# Patient Record
Sex: Male | Born: 1945 | Hispanic: Refuse to answer
Health system: Midwestern US, Academic
[De-identification: ages and names within clinical notes are randomized; demographics above are authoritative.]

---

## 2017-02-18 ENCOUNTER — Ambulatory Visit: Admit: 2017-02-18 | Discharge: 2017-02-19 | Payer: MEDICARE

## 2017-02-18 ENCOUNTER — Encounter: Admit: 2017-02-18 | Discharge: 2017-02-18

## 2017-02-18 DIAGNOSIS — I25118 Atherosclerotic heart disease of native coronary artery with other forms of angina pectoris: ICD-10-CM

## 2017-02-18 DIAGNOSIS — I10 Essential (primary) hypertension: ICD-10-CM

## 2017-02-18 DIAGNOSIS — C61 Malignant neoplasm of prostate: ICD-10-CM

## 2017-02-18 DIAGNOSIS — I4891 Unspecified atrial fibrillation: Principal | ICD-10-CM

## 2017-02-18 DIAGNOSIS — I480 Paroxysmal atrial fibrillation: Principal | ICD-10-CM

## 2017-02-18 DIAGNOSIS — I2510 Atherosclerotic heart disease of native coronary artery without angina pectoris: ICD-10-CM

## 2017-02-18 DIAGNOSIS — I447 Left bundle-branch block, unspecified: ICD-10-CM

## 2017-02-18 DIAGNOSIS — Q2549 Other congenital malformations of aorta: ICD-10-CM

## 2017-02-18 DIAGNOSIS — E785 Hyperlipidemia, unspecified: ICD-10-CM

## 2017-02-18 DIAGNOSIS — Z720 Tobacco use: Principal | ICD-10-CM

## 2017-02-18 LAB — COMPREHENSIVE METABOLIC PANEL
Lab: 0.9 — ABNORMAL LOW (ref 33.0–37.0)
Lab: 1.4 — ABNORMAL HIGH (ref 0.0–1.0)
Lab: 107
Lab: 140
Lab: 19
Lab: 27
Lab: 3.8
Lab: 38 — ABNORMAL HIGH (ref 5–34)
Lab: 43
Lab: 6.8
Lab: 81
Lab: 9.4
Lab: 90

## 2017-02-18 LAB — MAGNESIUM: Lab: 2.1 — ABNORMAL HIGH (ref 42.0–52.0)

## 2017-02-18 LAB — CBC: Lab: 6.4

## 2017-02-18 LAB — THYROID STIMULATING HORMONE-TSH: Lab: 1.7

## 2017-02-18 MED ORDER — APIXABAN 5 MG PO TAB
5 mg | ORAL_TABLET | Freq: Two times a day (BID) | ORAL | 3 refills | Status: AC
Start: 2017-02-18 — End: 2017-06-21

## 2017-02-18 MED ORDER — METOPROLOL TARTRATE 50 MG PO TAB
50 mg | ORAL_TABLET | Freq: Two times a day (BID) | ORAL | 3 refills | 90.00000 days | Status: AC
Start: 2017-02-18 — End: 2017-03-03

## 2017-02-22 ENCOUNTER — Encounter: Admit: 2017-02-22 | Discharge: 2017-02-22

## 2017-02-23 ENCOUNTER — Encounter: Admit: 2017-02-23 | Discharge: 2017-02-23

## 2017-02-23 ENCOUNTER — Ambulatory Visit: Admit: 2017-02-23 | Discharge: 2017-02-23

## 2017-02-23 ENCOUNTER — Ambulatory Visit: Admit: 2017-02-23 | Discharge: 2017-02-23 | Payer: MEDICARE

## 2017-02-23 DIAGNOSIS — I4891 Unspecified atrial fibrillation: Principal | ICD-10-CM

## 2017-02-23 LAB — POC POTASSIUM: Lab: 3.9 MMOL/L (ref 3.5–5.1)

## 2017-02-23 MED ORDER — PROPOFOL 10 MG/ML IV EMUL 20 ML (INFUSION)(AM)(OR)
INTRAVENOUS | 0 refills | Status: DC
Start: 2017-02-23 — End: 2017-02-23
  Administered 2017-02-23: 15:00:00 100 ug/kg/min via INTRAVENOUS

## 2017-02-23 MED ORDER — PROPOFOL INJ 10 MG/ML IV VIAL
0 refills | Status: DC
Start: 2017-02-23 — End: 2017-02-23
  Administered 2017-02-23 (×2): 20 mg via INTRAVENOUS
  Administered 2017-02-23 (×2): 30 mg via INTRAVENOUS

## 2017-02-23 MED ORDER — SODIUM CHLORIDE 0.9 % IV SOLP (OR) 500ML
0 refills | Status: DC
Start: 2017-02-23 — End: 2017-02-23
  Administered 2017-02-23: 15:00:00 via INTRAVENOUS

## 2017-03-01 ENCOUNTER — Encounter: Admit: 2017-03-01 | Discharge: 2017-03-01

## 2017-03-02 ENCOUNTER — Encounter: Admit: 2017-03-02 | Discharge: 2017-03-02

## 2017-03-03 ENCOUNTER — Encounter: Admit: 2017-03-03 | Discharge: 2017-03-03

## 2017-03-09 ENCOUNTER — Encounter: Admit: 2017-03-09 | Discharge: 2017-03-09

## 2017-03-09 DIAGNOSIS — I447 Left bundle-branch block, unspecified: ICD-10-CM

## 2017-03-09 DIAGNOSIS — E785 Hyperlipidemia, unspecified: ICD-10-CM

## 2017-03-09 DIAGNOSIS — I2510 Atherosclerotic heart disease of native coronary artery without angina pectoris: ICD-10-CM

## 2017-03-09 DIAGNOSIS — Q2549 Other congenital malformations of aorta: ICD-10-CM

## 2017-03-09 DIAGNOSIS — C61 Malignant neoplasm of prostate: ICD-10-CM

## 2017-03-09 DIAGNOSIS — I480 Paroxysmal atrial fibrillation: Principal | ICD-10-CM

## 2017-03-09 DIAGNOSIS — Z720 Tobacco use: Principal | ICD-10-CM

## 2017-03-09 DIAGNOSIS — I10 Essential (primary) hypertension: ICD-10-CM

## 2017-03-10 ENCOUNTER — Ambulatory Visit: Admit: 2017-03-09 | Discharge: 2017-03-10 | Payer: MEDICARE

## 2017-03-10 DIAGNOSIS — E039 Hypothyroidism, unspecified: ICD-10-CM

## 2017-03-10 DIAGNOSIS — I481 Persistent atrial fibrillation: Principal | ICD-10-CM

## 2017-03-10 DIAGNOSIS — I2510 Atherosclerotic heart disease of native coronary artery without angina pectoris: ICD-10-CM

## 2017-03-10 DIAGNOSIS — I501 Left ventricular failure: ICD-10-CM

## 2017-03-10 DIAGNOSIS — I4891 Unspecified atrial fibrillation: ICD-10-CM

## 2017-03-10 DIAGNOSIS — I10 Essential (primary) hypertension: ICD-10-CM

## 2017-03-10 DIAGNOSIS — I447 Left bundle-branch block, unspecified: ICD-10-CM

## 2017-03-18 DIAGNOSIS — I10 Essential (primary) hypertension: ICD-10-CM

## 2017-03-25 ENCOUNTER — Encounter: Admit: 2017-03-25 | Discharge: 2017-03-25

## 2017-03-25 ENCOUNTER — Inpatient Hospital Stay: Admit: 2017-03-25 | Discharge: 2017-03-25

## 2017-03-25 DIAGNOSIS — I447 Left bundle-branch block, unspecified: ICD-10-CM

## 2017-03-25 DIAGNOSIS — Z720 Tobacco use: Principal | ICD-10-CM

## 2017-03-25 DIAGNOSIS — I2510 Atherosclerotic heart disease of native coronary artery without angina pectoris: ICD-10-CM

## 2017-03-25 DIAGNOSIS — I10 Essential (primary) hypertension: ICD-10-CM

## 2017-03-25 DIAGNOSIS — F1011 Alcohol abuse, in remission: ICD-10-CM

## 2017-03-25 DIAGNOSIS — E785 Hyperlipidemia, unspecified: ICD-10-CM

## 2017-03-25 DIAGNOSIS — Q2549 Other congenital malformations of aorta: ICD-10-CM

## 2017-03-25 DIAGNOSIS — I480 Paroxysmal atrial fibrillation: ICD-10-CM

## 2017-03-25 DIAGNOSIS — C61 Malignant neoplasm of prostate: ICD-10-CM

## 2017-03-25 LAB — UREA NITROGEN-URINE RANDOM: Lab: 860 mg/dL

## 2017-03-25 LAB — URINALYSIS, MICROSCOPIC

## 2017-03-25 LAB — CBC
Lab: 118 10*3/uL — ABNORMAL LOW (ref >60)
Lab: 15 % — ABNORMAL HIGH (ref >60)
Lab: 29 pg (ref 26–34)
Lab: 32 g/dL (ref 32.0–36.0)
Lab: 49 % — ABNORMAL HIGH (ref 40–50)
Lab: 5.4 M/UL — ABNORMAL HIGH (ref 4.4–5.5)
Lab: 6 K/UL (ref 4.5–11.0)
Lab: 9.7 FL (ref >60)
Lab: 90 FL — ABNORMAL HIGH (ref 80–100)

## 2017-03-25 LAB — BNP (B-TYPE NATRIURETIC PEPTI)
Lab: 608 pg/mL — ABNORMAL HIGH (ref 0–100)
Lab: 666 pg/mL — ABNORMAL HIGH (ref 0–100)

## 2017-03-25 LAB — CREATININE-URINE RANDOM: Lab: 137 mg/dL

## 2017-03-25 LAB — URINALYSIS DIPSTICK
Lab: NEGATIVE
Lab: NEGATIVE

## 2017-03-25 LAB — SODIUM-URINE RANDOM: Lab: 174 MMOL/L

## 2017-03-25 LAB — BASIC METABOLIC PANEL
Lab: 140 MMOL/L (ref 137–147)
Lab: 4.4 MMOL/L (ref 3.5–5.1)

## 2017-03-25 LAB — MAGNESIUM: Lab: 2.2 mg/dL (ref 1.6–2.6)

## 2017-03-25 MED ORDER — APIXABAN 5 MG PO TAB
5 mg | Freq: Two times a day (BID) | ORAL | 0 refills | Status: DC
Start: 2017-03-25 — End: 2017-03-27
  Administered 2017-03-26 – 2017-03-27 (×3): 5 mg via ORAL

## 2017-03-25 MED ORDER — POTASSIUM CHLORIDE 20 MEQ/15 ML PO LIQD
20-40 meq | ORAL | 0 refills | Status: AC | PRN
Start: 2017-03-25 — End: ?

## 2017-03-25 MED ORDER — FUROSEMIDE 20 MG PO TAB
20 mg | Freq: Every morning | ORAL | 0 refills | Status: DC
Start: 2017-03-25 — End: 2017-03-25

## 2017-03-25 MED ORDER — POTASSIUM CHLORIDE 20 MEQ PO TBTQ
20-40 meq | ORAL | 0 refills | Status: AC | PRN
Start: 2017-03-25 — End: ?

## 2017-03-25 MED ORDER — ASPIRIN 81 MG PO TBEC
81 mg | Freq: Every day | ORAL | 0 refills | Status: DC
Start: 2017-03-25 — End: 2017-03-27
  Administered 2017-03-26 – 2017-03-27 (×2): 81 mg via ORAL

## 2017-03-25 MED ORDER — POTASSIUM CHLORIDE 10 MEQ PO TBTQ
10 meq | Freq: Every day | ORAL | 0 refills | Status: DC
Start: 2017-03-25 — End: 2017-03-25

## 2017-03-25 MED ORDER — SODIUM CHLORIDE 0.9 % IV SOLP
INTRAVENOUS | 0 refills | Status: DC
Start: 2017-03-25 — End: 2017-03-27
  Administered 2017-03-26: 16:00:00 1000.000 mL via INTRAVENOUS

## 2017-03-25 MED ORDER — SODIUM CHLORIDE 0.9 % IV SOLP
500 mL | Freq: Once | INTRAVENOUS | 0 refills | Status: CP
Start: 2017-03-25 — End: ?
  Administered 2017-03-25: 21:00:00 500 mL via INTRAVENOUS

## 2017-03-25 MED ORDER — SOTALOL 120 MG PO TAB
120 mg | Freq: Every day | ORAL | 0 refills | Status: DC
Start: 2017-03-25 — End: 2017-03-26
  Administered 2017-03-25: 20:00:00 120 mg via ORAL

## 2017-03-25 MED ORDER — SODIUM CHLORIDE 0.9 % IV SOLP
INTRAVENOUS | 0 refills | Status: CN
Start: 2017-03-25 — End: ?

## 2017-03-25 MED ORDER — MAGNESIUM SULFATE IN D5W 1 GRAM/100 ML IV PGBK
1 g | INTRAVENOUS | 0 refills | Status: AC | PRN
Start: 2017-03-25 — End: ?

## 2017-03-25 MED ORDER — METOPROLOL TARTRATE 50 MG PO TAB
50 mg | Freq: Two times a day (BID) | ORAL | 0 refills | Status: DC
Start: 2017-03-25 — End: 2017-03-27
  Administered 2017-03-26 – 2017-03-27 (×3): 50 mg via ORAL

## 2017-03-25 MED ORDER — ATORVASTATIN 40 MG PO TAB
80 mg | Freq: Every evening | ORAL | 0 refills | Status: DC
Start: 2017-03-25 — End: 2017-03-27
  Administered 2017-03-27: 02:00:00 80 mg via ORAL

## 2017-03-26 ENCOUNTER — Inpatient Hospital Stay: Admit: 2017-03-26 | Discharge: 2017-03-26

## 2017-03-26 ENCOUNTER — Encounter: Admit: 2017-03-26 | Discharge: 2017-03-26

## 2017-03-26 ENCOUNTER — Ambulatory Visit: Admit: 2017-03-26 | Discharge: 2017-03-26

## 2017-03-26 ENCOUNTER — Ambulatory Visit: Admit: 2017-03-26 | Discharge: 2017-03-26 | Payer: MEDICARE

## 2017-03-26 DIAGNOSIS — Z720 Tobacco use: Principal | ICD-10-CM

## 2017-03-26 DIAGNOSIS — I481 Persistent atrial fibrillation: Principal | ICD-10-CM

## 2017-03-26 DIAGNOSIS — I480 Paroxysmal atrial fibrillation: Secondary | ICD-10-CM

## 2017-03-26 DIAGNOSIS — Q2549 Other congenital malformations of aorta: ICD-10-CM

## 2017-03-26 DIAGNOSIS — I2510 Atherosclerotic heart disease of native coronary artery without angina pectoris: ICD-10-CM

## 2017-03-26 DIAGNOSIS — F1011 Alcohol abuse, in remission: ICD-10-CM

## 2017-03-26 DIAGNOSIS — I10 Essential (primary) hypertension: ICD-10-CM

## 2017-03-26 DIAGNOSIS — C61 Malignant neoplasm of prostate: ICD-10-CM

## 2017-03-26 DIAGNOSIS — I447 Left bundle-branch block, unspecified: ICD-10-CM

## 2017-03-26 DIAGNOSIS — E785 Hyperlipidemia, unspecified: ICD-10-CM

## 2017-03-26 LAB — MAGNESIUM: Lab: 2.2 mg/dL — ABNORMAL LOW (ref 1.6–2.6)

## 2017-03-26 LAB — PROTIME INR (PT): Lab: 1.7 MMOL/L — ABNORMAL HIGH (ref 0.8–1.2)

## 2017-03-26 LAB — BASIC METABOLIC PANEL
Lab: 140 MMOL/L — ABNORMAL HIGH (ref 137–147)
Lab: 4.7 MMOL/L — ABNORMAL HIGH (ref 3.5–5.1)

## 2017-03-26 LAB — LIPID PROFILE: Lab: 114 mg/dL — ABNORMAL LOW (ref <200)

## 2017-03-26 MED ORDER — SODIUM CHLORIDE 0.9 % IJ SOLN
50 mL | Freq: Once | INTRAVENOUS | 0 refills | Status: CP
Start: 2017-03-26 — End: ?
  Administered 2017-03-26: 14:00:00 50 mL via INTRAVENOUS

## 2017-03-26 MED ORDER — PROPOFOL INJ 10 MG/ML IV VIAL
0 refills | Status: DC
Start: 2017-03-26 — End: 2017-03-26
  Administered 2017-03-26: 21:00:00 50 mg via INTRAVENOUS

## 2017-03-26 MED ORDER — SOTALOL 120 MG PO TAB
120 mg | Freq: Every day | ORAL | 0 refills | Status: DC
Start: 2017-03-26 — End: 2017-03-27
  Administered 2017-03-26 – 2017-03-27 (×2): 120 mg via ORAL

## 2017-03-26 MED ORDER — MELATONIN 5 MG PO TAB
5 mg | Freq: Every evening | ORAL | 0 refills | Status: DC | PRN
Start: 2017-03-26 — End: 2017-03-27
  Administered 2017-03-26 – 2017-03-27 (×2): 5 mg via ORAL

## 2017-03-26 MED ORDER — IOHEXOL 350 MG IODINE/ML IV SOLN
80 mL | Freq: Once | INTRAVENOUS | 0 refills | Status: CP
Start: 2017-03-26 — End: ?
  Administered 2017-03-26: 14:00:00 80 mL via INTRAVENOUS

## 2017-03-26 MED ORDER — LIDOCAINE (PF) 200 MG/10 ML (2 %) IJ SYRG
0 refills | Status: DC
Start: 2017-03-26 — End: 2017-03-26
  Administered 2017-03-26: 21:00:00 40 mg via INTRAVENOUS

## 2017-03-27 ENCOUNTER — Ambulatory Visit: Admit: 2017-03-25 | Discharge: 2017-03-25

## 2017-03-27 ENCOUNTER — Inpatient Hospital Stay: Admit: 2017-03-25 | Discharge: 2017-03-27 | Disposition: A | Discharge status: Home / Self Care | Payer: MEDICARE

## 2017-03-27 ENCOUNTER — Encounter: Admit: 2017-03-27 | Discharge: 2017-03-27

## 2017-03-27 DIAGNOSIS — I2510 Atherosclerotic heart disease of native coronary artery without angina pectoris: ICD-10-CM

## 2017-03-27 DIAGNOSIS — Z5181 Encounter for therapeutic drug level monitoring: ICD-10-CM

## 2017-03-27 DIAGNOSIS — I110 Hypertensive heart disease with heart failure: ICD-10-CM

## 2017-03-27 DIAGNOSIS — E785 Hyperlipidemia, unspecified: ICD-10-CM

## 2017-03-27 DIAGNOSIS — Z8546 Personal history of malignant neoplasm of prostate: ICD-10-CM

## 2017-03-27 DIAGNOSIS — I447 Left bundle-branch block, unspecified: ICD-10-CM

## 2017-03-27 DIAGNOSIS — E039 Hypothyroidism, unspecified: ICD-10-CM

## 2017-03-27 DIAGNOSIS — N179 Acute kidney failure, unspecified: ICD-10-CM

## 2017-03-27 DIAGNOSIS — Z9861 Coronary angioplasty status: ICD-10-CM

## 2017-03-27 DIAGNOSIS — I481 Persistent atrial fibrillation: Principal | ICD-10-CM

## 2017-03-27 DIAGNOSIS — I5032 Chronic diastolic (congestive) heart failure: ICD-10-CM

## 2017-03-27 LAB — MAGNESIUM: Lab: 2.1 mg/dL — ABNORMAL LOW (ref >60)

## 2017-03-27 LAB — BASIC METABOLIC PANEL
Lab: 140 MMOL/L — ABNORMAL HIGH (ref 137–147)
Lab: 4.8 MMOL/L — ABNORMAL HIGH (ref >60)

## 2017-03-27 MED ORDER — METOPROLOL TARTRATE 25 MG PO TAB
25 mg | Freq: Two times a day (BID) | ORAL | 0 refills | Status: DC
Start: 2017-03-27 — End: 2017-03-27

## 2017-03-27 MED ORDER — METOPROLOL TARTRATE 25 MG PO TAB
25 mg | ORAL_TABLET | Freq: Two times a day (BID) | ORAL | 3 refills | 90.00000 days | Status: AC
Start: 2017-03-27 — End: 2017-05-12
  Filled 2017-03-27 (×2): qty 180, 90d supply, fill #1

## 2017-03-27 MED ORDER — SOTALOL 120 MG PO TAB
120 mg | ORAL_TABLET | Freq: Every day | ORAL | 1 refills | 30.00000 days | Status: AC
Start: 2017-03-27 — End: 2017-06-28
  Filled 2017-03-27 (×2): qty 90, 90d supply, fill #1

## 2017-04-07 ENCOUNTER — Encounter: Admit: 2017-04-07 | Discharge: 2017-04-07

## 2017-04-15 ENCOUNTER — Encounter: Admit: 2017-04-15 | Discharge: 2017-04-15

## 2017-04-15 DIAGNOSIS — I10 Essential (primary) hypertension: ICD-10-CM

## 2017-04-15 DIAGNOSIS — Q2549 Other congenital malformations of aorta: ICD-10-CM

## 2017-04-15 DIAGNOSIS — I2510 Atherosclerotic heart disease of native coronary artery without angina pectoris: ICD-10-CM

## 2017-04-15 DIAGNOSIS — F1011 Alcohol abuse, in remission: ICD-10-CM

## 2017-04-15 DIAGNOSIS — I447 Left bundle-branch block, unspecified: ICD-10-CM

## 2017-04-15 DIAGNOSIS — C61 Malignant neoplasm of prostate: ICD-10-CM

## 2017-04-15 DIAGNOSIS — Z720 Tobacco use: Principal | ICD-10-CM

## 2017-04-15 DIAGNOSIS — E785 Hyperlipidemia, unspecified: ICD-10-CM

## 2017-04-23 ENCOUNTER — Encounter: Admit: 2017-04-23 | Discharge: 2017-04-23

## 2017-04-27 ENCOUNTER — Encounter: Admit: 2017-04-27 | Discharge: 2017-04-27

## 2017-04-27 DIAGNOSIS — I447 Left bundle-branch block, unspecified: Principal | ICD-10-CM

## 2017-05-12 ENCOUNTER — Encounter: Admit: 2017-05-12 | Discharge: 2017-05-12

## 2017-05-12 ENCOUNTER — Ambulatory Visit: Admit: 2017-05-12 | Discharge: 2017-05-13 | Payer: MEDICARE

## 2017-05-12 DIAGNOSIS — I4819 Other persistent atrial fibrillation: Principal | ICD-10-CM

## 2017-05-12 DIAGNOSIS — C61 Malignant neoplasm of prostate: ICD-10-CM

## 2017-05-12 DIAGNOSIS — E785 Hyperlipidemia, unspecified: ICD-10-CM

## 2017-05-12 DIAGNOSIS — I10 Essential (primary) hypertension: ICD-10-CM

## 2017-05-12 DIAGNOSIS — I480 Paroxysmal atrial fibrillation: Principal | ICD-10-CM

## 2017-05-12 DIAGNOSIS — I447 Left bundle-branch block, unspecified: ICD-10-CM

## 2017-05-12 DIAGNOSIS — Q2549 Other congenital malformations of aorta: ICD-10-CM

## 2017-05-12 DIAGNOSIS — F1011 Alcohol abuse, in remission: ICD-10-CM

## 2017-05-12 DIAGNOSIS — I2510 Atherosclerotic heart disease of native coronary artery without angina pectoris: ICD-10-CM

## 2017-05-12 DIAGNOSIS — Z720 Tobacco use: Principal | ICD-10-CM

## 2017-05-12 MED ORDER — PANTOPRAZOLE 40 MG PO TBEC
ORAL_TABLET | Freq: Two times a day (BID) | ORAL | 1 refills | 90.00000 days | Status: AC
Start: 2017-05-12 — End: 2017-10-27

## 2017-05-12 MED ORDER — LIDOCAINE HCL 2 % MM JELP
Freq: Once | TOPICAL | 0 refills | Status: CN
Start: 2017-05-12 — End: ?

## 2017-05-12 MED ORDER — SODIUM CHLORIDE 0.9 % IV SOLP
INTRAVENOUS | 0 refills | Status: CN
Start: 2017-05-12 — End: ?

## 2017-05-12 MED ORDER — LIDOCAINE (PF) 10 MG/ML (1 %) IJ SOLN
.1-2 mL | INTRAMUSCULAR | 0 refills | Status: CN | PRN
Start: 2017-05-12 — End: ?

## 2017-05-12 MED ORDER — METOPROLOL TARTRATE 25 MG PO TAB
37.5 mg | ORAL_TABLET | Freq: Two times a day (BID) | ORAL | 3 refills | 90.00000 days | Status: AC
Start: 2017-05-12 — End: 2018-04-15

## 2017-06-01 ENCOUNTER — Encounter: Admit: 2017-06-01 | Discharge: 2017-06-01

## 2017-06-04 LAB — CBC: Lab: 5.3

## 2017-06-04 LAB — BASIC METABOLIC PANEL
Lab: 1 — ABNORMAL LOW (ref 33.0–37.0)
Lab: 103 — ABNORMAL HIGH (ref 42.0–52.0)
Lab: 14
Lab: 141
Lab: 28
Lab: 29 — ABNORMAL HIGH (ref 8.4–25.7)
Lab: 4.4
Lab: 9.5
Lab: 96

## 2017-06-04 LAB — MAGNESIUM: Lab: 2.2

## 2017-06-06 ENCOUNTER — Encounter: Admit: 2017-06-06 | Discharge: 2017-06-06

## 2017-06-06 MED ORDER — ACETAMINOPHEN 325 MG PO TAB
650 mg | ORAL | 0 refills | Status: CN | PRN
Start: 2017-06-06 — End: ?

## 2017-06-06 MED ORDER — MAGNESIUM HYDROXIDE 2,400 MG/10 ML PO SUSP
10 mL | ORAL | 0 refills | Status: CN | PRN
Start: 2017-06-06 — End: ?

## 2017-06-08 ENCOUNTER — Encounter: Admit: 2017-06-08 | Discharge: 2017-06-08

## 2017-06-08 DIAGNOSIS — I480 Paroxysmal atrial fibrillation: Principal | ICD-10-CM

## 2017-06-11 ENCOUNTER — Encounter: Admit: 2017-06-11 | Discharge: 2017-06-11

## 2017-06-14 ENCOUNTER — Ambulatory Visit: Admit: 2017-06-14 | Discharge: 2017-06-15

## 2017-06-14 ENCOUNTER — Encounter: Admit: 2017-06-14 | Discharge: 2017-06-15 | Payer: MEDICARE

## 2017-06-14 ENCOUNTER — Encounter: Admit: 2017-06-14 | Discharge: 2017-06-14

## 2017-06-14 ENCOUNTER — Ambulatory Visit: Admit: 2017-06-14 | Discharge: 2017-06-14 | Payer: MEDICARE

## 2017-06-14 DIAGNOSIS — I447 Left bundle-branch block, unspecified: ICD-10-CM

## 2017-06-14 DIAGNOSIS — I4819 Other persistent atrial fibrillation: ICD-10-CM

## 2017-06-14 DIAGNOSIS — C61 Malignant neoplasm of prostate: ICD-10-CM

## 2017-06-14 DIAGNOSIS — E785 Hyperlipidemia, unspecified: ICD-10-CM

## 2017-06-14 DIAGNOSIS — F1011 Alcohol abuse, in remission: ICD-10-CM

## 2017-06-14 DIAGNOSIS — I10 Essential (primary) hypertension: ICD-10-CM

## 2017-06-14 DIAGNOSIS — Q2549 Other congenital malformations of aorta: ICD-10-CM

## 2017-06-14 DIAGNOSIS — I2510 Atherosclerotic heart disease of native coronary artery without angina pectoris: ICD-10-CM

## 2017-06-14 DIAGNOSIS — Z720 Tobacco use: Principal | ICD-10-CM

## 2017-06-14 LAB — BASIC METABOLIC PANEL
Lab: 101 MMOL/L (ref 98–110)
Lab: 138 MMOL/L (ref 137–147)
Lab: 23 mg/dL (ref 7–25)
Lab: 32 MMOL/L — ABNORMAL HIGH (ref 21–30)
Lab: 5 (ref 3–12)
Lab: 9.5 mg/dL (ref 8.5–10.6)
Lab: >60 mL/min (ref >60)
Lab: >60 mL/min (ref >60)

## 2017-06-14 LAB — POC PT/INR: Lab: 1.3 — ABNORMAL HIGH (ref 0.8–1.2)

## 2017-06-14 LAB — POC ACTIVATED CLOTTING TIME
Lab: 369 s
Lab: 363 s
Lab: 366 s
Lab: 323 s
Lab: 355 s
Lab: 360 s
Lab: 241 s

## 2017-06-14 LAB — MAGNESIUM: Lab: 2 mg/dL (ref 1.6–2.6)

## 2017-06-14 MED ORDER — PANTOPRAZOLE 40 MG PO TBEC
40 mg | Freq: Two times a day (BID) | ORAL | 0 refills | Status: DC
Start: 2017-06-14 — End: 2017-06-15
  Administered 2017-06-15: 02:00:00 40 mg via ORAL

## 2017-06-14 MED ORDER — DEXTRAN 70-HYPROMELLOSE (PF) 0.1-0.3 % OP DPET
0 refills | Status: DC
Start: 2017-06-14 — End: 2017-06-14

## 2017-06-14 MED ORDER — OXYCODONE 5 MG PO TAB
5-10 mg | Freq: Once | ORAL | 0 refills | Status: AC | PRN
Start: 2017-06-14 — End: ?

## 2017-06-14 MED ORDER — FENTANYL CITRATE (PF) 50 MCG/ML IJ SOLN
25 ug | INTRAVENOUS | 0 refills | Status: DC | PRN
Start: 2017-06-14 — End: 2017-06-15

## 2017-06-14 MED ORDER — DIPHENHYDRAMINE HCL 50 MG/ML IJ SOLN
25 mg | Freq: Once | INTRAVENOUS | 0 refills | Status: DC | PRN
Start: 2017-06-14 — End: 2017-06-14

## 2017-06-14 MED ORDER — SOTALOL 120 MG PO TAB
120 mg | Freq: Every day | ORAL | 0 refills | Status: DC
Start: 2017-06-14 — End: 2017-06-15
  Administered 2017-06-14: 120 mg via ORAL

## 2017-06-14 MED ORDER — DEXAMETHASONE SODIUM PHOSPHATE 4 MG/ML IJ SOLN
INTRAVENOUS | 0 refills | Status: DC
Start: 2017-06-14 — End: 2017-06-14

## 2017-06-14 MED ORDER — ACETAMINOPHEN 325 MG PO TAB
650 mg | ORAL | 0 refills | Status: DC | PRN
Start: 2017-06-14 — End: 2017-06-15

## 2017-06-14 MED ORDER — PROTAMINE IVPB
20 mg | INTRAVENOUS | 0 refills | Status: AC | PRN
Start: 2017-06-14 — End: ?

## 2017-06-14 MED ORDER — HALOPERIDOL LACTATE 5 MG/ML IJ SOLN
0 refills | Status: DC
Start: 2017-06-14 — End: 2017-06-14

## 2017-06-14 MED ORDER — ATORVASTATIN 40 MG PO TAB
80 mg | Freq: Every day | ORAL | 0 refills | Status: DC
Start: 2017-06-14 — End: 2017-06-15

## 2017-06-14 MED ORDER — HYDROMORPHONE (PF) 2 MG/ML IJ SYRG
1 mg | INTRAVENOUS | 0 refills | Status: DC | PRN
Start: 2017-06-14 — End: 2017-06-15

## 2017-06-14 MED ORDER — POTASSIUM CHLORIDE 10 MEQ PO TBTQ
10 meq | Freq: Every day | ORAL | 0 refills | Status: DC
Start: 2017-06-14 — End: 2017-06-15
  Administered 2017-06-14: 10 meq via ORAL

## 2017-06-14 MED ORDER — MEPERIDINE (PF) 25 MG/ML IJ SYRG
12.5 mg | INTRAVENOUS | 0 refills | Status: DC | PRN
Start: 2017-06-14 — End: 2017-06-15

## 2017-06-14 MED ORDER — ONDANSETRON HCL (PF) 4 MG/2 ML IJ SOLN
4 mg | INTRAVENOUS | 0 refills | Status: DC | PRN
Start: 2017-06-14 — End: 2017-06-15

## 2017-06-14 MED ORDER — ROCURONIUM 10 MG/ML IV SOLN
INTRAVENOUS | 0 refills | Status: DC
Start: 2017-06-14 — End: 2017-06-14

## 2017-06-14 MED ORDER — SUCRALFATE 1 GRAM PO TAB
1 g | Freq: Two times a day (BID) | ORAL | 0 refills | Status: DC
Start: 2017-06-14 — End: 2017-06-15
  Administered 2017-06-14 – 2017-06-15 (×2): 1 g via ORAL

## 2017-06-14 MED ORDER — LIDOCAINE (PF) 200 MG/10 ML (2 %) IJ SYRG
0 refills | Status: DC
Start: 2017-06-14 — End: 2017-06-14

## 2017-06-14 MED ORDER — PHENYLEPHRINE IV DRIP (STD CONC)
0 refills | Status: DC
Start: 2017-06-14 — End: 2017-06-14

## 2017-06-14 MED ORDER — PHENYLEPHRINE IN 0.9% NACL(PF) 1 MG/10 ML (100 MCG/ML) IV SYRG
INTRAVENOUS | 0 refills | Status: DC
Start: 2017-06-14 — End: 2017-06-14

## 2017-06-14 MED ORDER — SUGAMMADEX 100 MG/ML IV SOLN
INTRAVENOUS | 0 refills | Status: DC
Start: 2017-06-14 — End: 2017-06-14

## 2017-06-14 MED ORDER — PANTOPRAZOLE 40 MG PO TBEC
40 mg | Freq: Two times a day (BID) | ORAL | 0 refills | Status: DC
Start: 2017-06-14 — End: 2017-06-14

## 2017-06-14 MED ORDER — HYDROCODONE-ACETAMINOPHEN 5-325 MG PO TAB
1 | ORAL | 0 refills | Status: DC | PRN
Start: 2017-06-14 — End: 2017-06-15

## 2017-06-14 MED ORDER — FENTANYL CITRATE (PF) 50 MCG/ML IJ SOLN
50 ug | Freq: Once | INTRAVENOUS | 0 refills | Status: AC | PRN
Start: 2017-06-14 — End: ?

## 2017-06-14 MED ORDER — SODIUM CHLORIDE 0.9 % IV SOLP
INTRAVENOUS | 0 refills | Status: DC
Start: 2017-06-14 — End: 2017-06-15
  Administered 2017-06-14 (×2): 1000.000 mL via INTRAVENOUS

## 2017-06-14 MED ORDER — FUROSEMIDE 20 MG PO TAB
20 mg | Freq: Every morning | ORAL | 0 refills | Status: DC
Start: 2017-06-14 — End: 2017-06-15

## 2017-06-14 MED ORDER — HALOPERIDOL LACTATE 5 MG/ML IJ SOLN
1 mg | Freq: Once | INTRAVENOUS | 0 refills | Status: AC | PRN
Start: 2017-06-14 — End: ?

## 2017-06-14 MED ORDER — FENTANYL CITRATE (PF) 50 MCG/ML IJ SOLN
50 ug | INTRAVENOUS | 0 refills | Status: DC | PRN
Start: 2017-06-14 — End: 2017-06-15

## 2017-06-14 MED ORDER — LIDOCAINE (PF) 10 MG/ML (1 %) IJ SOLN
.1-2 mL | INTRAMUSCULAR | 0 refills | Status: DC | PRN
Start: 2017-06-14 — End: 2017-06-15

## 2017-06-14 MED ORDER — DIPHENHYDRAMINE HCL 50 MG/ML IJ SOLN
25 mg | Freq: Once | INTRAVENOUS | 0 refills | Status: AC | PRN
Start: 2017-06-14 — End: ?

## 2017-06-14 MED ORDER — LIDOCAINE HCL 2 % MM JELP
Freq: Once | TOPICAL | 0 refills | Status: CP
Start: 2017-06-14 — End: ?

## 2017-06-14 MED ORDER — APIXABAN 5 MG PO TAB
5 mg | Freq: Two times a day (BID) | ORAL | 0 refills | Status: DC
Start: 2017-06-14 — End: 2017-06-15
  Administered 2017-06-15: 02:00:00 5 mg via ORAL

## 2017-06-14 MED ORDER — MAGNESIUM HYDROXIDE 2,400 MG/10 ML PO SUSP
10 mL | ORAL | 0 refills | Status: DC | PRN
Start: 2017-06-14 — End: 2017-06-15

## 2017-06-14 MED ORDER — PROPOFOL INJ 10 MG/ML IV VIAL
0 refills | Status: DC
Start: 2017-06-14 — End: 2017-06-14

## 2017-06-14 MED ORDER — FENTANYL CITRATE (PF) 50 MCG/ML IJ SOLN
0 refills | Status: DC
Start: 2017-06-14 — End: 2017-06-14

## 2017-06-14 MED ORDER — ASPIRIN 81 MG PO TBEC
81 mg | Freq: Every day | ORAL | 0 refills | Status: DC
Start: 2017-06-14 — End: 2017-06-15
  Administered 2017-06-14: 81 mg via ORAL

## 2017-06-14 MED ORDER — PROMETHAZINE 25 MG/ML IJ SOLN
6.25 mg | INTRAVENOUS | 0 refills | Status: DC | PRN
Start: 2017-06-14 — End: 2017-06-15

## 2017-06-14 MED ORDER — METOPROLOL TARTRATE 25 MG PO TAB
37.5 mg | Freq: Two times a day (BID) | ORAL | 0 refills | Status: DC
Start: 2017-06-14 — End: 2017-06-15
  Administered 2017-06-15: 02:00:00 37.5 mg via ORAL

## 2017-06-15 DIAGNOSIS — I481 Persistent atrial fibrillation: Principal | ICD-10-CM

## 2017-06-15 DIAGNOSIS — I480 Paroxysmal atrial fibrillation: ICD-10-CM

## 2017-06-15 LAB — BASIC METABOLIC PANEL
Lab: 131 MMOL/L — ABNORMAL LOW (ref 137–147)
Lab: 138 mg/dL — ABNORMAL HIGH (ref 70–100)
Lab: 27 MMOL/L (ref 21–30)
Lab: 4.5 MMOL/L (ref 3.5–5.1)
Lab: 6 pg (ref 3–12)
Lab: >60 mL/min (ref >60)

## 2017-06-15 LAB — CBC
Lab: 11 10*3/uL — ABNORMAL HIGH (ref 4.5–11.0)
Lab: 17 % — ABNORMAL HIGH (ref 11–15)
Lab: 46 % (ref 40–50)

## 2017-06-15 LAB — MAGNESIUM: Lab: 1.7 mg/dL (ref 1.6–2.6)

## 2017-06-15 MED ORDER — SUCRALFATE 1 GRAM PO TAB
1 g | ORAL_TABLET | Freq: Two times a day (BID) | ORAL | 0 refills | Status: AC
Start: 2017-06-15 — End: 2017-10-27

## 2017-06-16 ENCOUNTER — Encounter: Admit: 2017-06-16 | Discharge: 2017-06-16

## 2017-06-16 DIAGNOSIS — F1011 Alcohol abuse, in remission: ICD-10-CM

## 2017-06-16 DIAGNOSIS — I10 Essential (primary) hypertension: ICD-10-CM

## 2017-06-16 DIAGNOSIS — I2510 Atherosclerotic heart disease of native coronary artery without angina pectoris: ICD-10-CM

## 2017-06-16 DIAGNOSIS — Z720 Tobacco use: Principal | ICD-10-CM

## 2017-06-16 DIAGNOSIS — E785 Hyperlipidemia, unspecified: ICD-10-CM

## 2017-06-16 DIAGNOSIS — Q2549 Other congenital malformations of aorta: ICD-10-CM

## 2017-06-16 DIAGNOSIS — C61 Malignant neoplasm of prostate: ICD-10-CM

## 2017-06-16 DIAGNOSIS — I447 Left bundle-branch block, unspecified: ICD-10-CM

## 2017-06-21 ENCOUNTER — Encounter: Admit: 2017-06-21 | Discharge: 2017-06-21

## 2017-06-21 MED ORDER — APIXABAN 5 MG PO TAB
5 mg | ORAL_TABLET | Freq: Two times a day (BID) | ORAL | 11 refills | Status: AC
Start: 2017-06-21 — End: 2018-06-20

## 2017-06-28 ENCOUNTER — Encounter: Admit: 2017-06-28 | Discharge: 2017-06-28

## 2017-06-28 MED ORDER — SOTALOL 120 MG PO TAB
120 mg | ORAL_TABLET | Freq: Every day | ORAL | 3 refills | 30.00000 days | Status: AC
Start: 2017-06-28 — End: 2017-07-28

## 2017-07-28 ENCOUNTER — Ambulatory Visit: Admit: 2017-07-28 | Discharge: 2017-07-29 | Payer: MEDICARE

## 2017-07-29 DIAGNOSIS — Z7901 Long term (current) use of anticoagulants: ICD-10-CM

## 2017-07-29 DIAGNOSIS — I10 Essential (primary) hypertension: ICD-10-CM

## 2017-07-29 DIAGNOSIS — I480 Paroxysmal atrial fibrillation: ICD-10-CM

## 2017-07-29 DIAGNOSIS — I481 Persistent atrial fibrillation: Principal | ICD-10-CM

## 2017-08-24 ENCOUNTER — Encounter: Admit: 2017-08-24 | Discharge: 2017-08-24

## 2017-08-24 DIAGNOSIS — I480 Paroxysmal atrial fibrillation: Principal | ICD-10-CM

## 2017-09-27 ENCOUNTER — Encounter: Admit: 2017-09-27 | Discharge: 2017-09-27

## 2017-09-30 ENCOUNTER — Encounter: Admit: 2017-09-30 | Discharge: 2017-09-30

## 2017-10-04 ENCOUNTER — Encounter: Admit: 2017-10-04 | Discharge: 2017-10-04

## 2017-10-08 ENCOUNTER — Ambulatory Visit: Admit: 2017-10-08 | Discharge: 2017-10-09 | Payer: MEDICARE

## 2017-10-08 ENCOUNTER — Encounter: Admit: 2017-10-08 | Discharge: 2017-10-08

## 2017-10-08 DIAGNOSIS — I480 Paroxysmal atrial fibrillation: ICD-10-CM

## 2017-10-08 DIAGNOSIS — I4819 Other persistent atrial fibrillation: Principal | ICD-10-CM

## 2017-10-27 ENCOUNTER — Encounter: Admit: 2017-10-27 | Discharge: 2017-10-27

## 2017-10-27 ENCOUNTER — Ambulatory Visit: Admit: 2017-10-27 | Discharge: 2017-10-28 | Payer: MEDICARE

## 2017-10-27 DIAGNOSIS — E785 Hyperlipidemia, unspecified: ICD-10-CM

## 2017-10-27 DIAGNOSIS — Z720 Tobacco use: Principal | ICD-10-CM

## 2017-10-27 DIAGNOSIS — C61 Malignant neoplasm of prostate: ICD-10-CM

## 2017-10-27 DIAGNOSIS — I10 Essential (primary) hypertension: ICD-10-CM

## 2017-10-27 DIAGNOSIS — I447 Left bundle-branch block, unspecified: ICD-10-CM

## 2017-10-27 DIAGNOSIS — Q2549 Other congenital malformations of aorta: ICD-10-CM

## 2017-10-27 DIAGNOSIS — I25118 Atherosclerotic heart disease of native coronary artery with other forms of angina pectoris: Principal | ICD-10-CM

## 2017-10-27 DIAGNOSIS — F1011 Alcohol abuse, in remission: ICD-10-CM

## 2017-10-27 DIAGNOSIS — I2510 Atherosclerotic heart disease of native coronary artery without angina pectoris: ICD-10-CM

## 2017-10-27 DIAGNOSIS — I4819 Other persistent atrial fibrillation: ICD-10-CM

## 2017-10-27 MED ORDER — LISINOPRIL 10 MG PO TAB
10 mg | ORAL_TABLET | Freq: Every day | ORAL | 3 refills | Status: AC
Start: 2017-10-27 — End: 2018-08-15

## 2018-03-28 ENCOUNTER — Encounter: Admit: 2018-03-28 | Discharge: 2018-03-28

## 2018-03-28 NOTE — Telephone Encounter
03/28/18 - discussed with patient need to get rescheduled from 3/17 to 6/11 - verbalized understanding and agreement. Stephens November, RN

## 2018-04-15 ENCOUNTER — Encounter: Admit: 2018-04-15 | Discharge: 2018-04-15

## 2018-04-15 MED ORDER — METOPROLOL TARTRATE 25 MG PO TAB
37.5 mg | ORAL_TABLET | Freq: Two times a day (BID) | ORAL | 3 refills | 90.00000 days | Status: DC
Start: 2018-04-15 — End: 2019-05-19

## 2018-06-18 ENCOUNTER — Encounter: Admit: 2018-06-18 | Discharge: 2018-06-18

## 2018-06-20 MED ORDER — ELIQUIS 5 MG PO TAB
ORAL_TABLET | Freq: Two times a day (BID) | 11 refills | Status: DC
Start: 2018-06-20 — End: 2019-06-23

## 2018-06-23 ENCOUNTER — Encounter: Admit: 2018-06-23 | Discharge: 2018-06-23

## 2018-06-23 ENCOUNTER — Ambulatory Visit: Admit: 2018-06-23 | Discharge: 2018-06-24

## 2018-06-23 DIAGNOSIS — Q2547 Right aortic arch: Secondary | ICD-10-CM

## 2018-06-23 DIAGNOSIS — I447 Left bundle-branch block, unspecified: Secondary | ICD-10-CM

## 2018-06-23 DIAGNOSIS — C61 Malignant neoplasm of prostate: Secondary | ICD-10-CM

## 2018-06-23 DIAGNOSIS — I25118 Atherosclerotic heart disease of native coronary artery with other forms of angina pectoris: Secondary | ICD-10-CM

## 2018-06-23 DIAGNOSIS — Z720 Tobacco use: Secondary | ICD-10-CM

## 2018-06-23 DIAGNOSIS — Q2549 Other congenital malformations of aorta: Secondary | ICD-10-CM

## 2018-06-23 DIAGNOSIS — I10 Essential (primary) hypertension: Secondary | ICD-10-CM

## 2018-06-23 DIAGNOSIS — E7800 Pure hypercholesterolemia, unspecified: Secondary | ICD-10-CM

## 2018-06-23 DIAGNOSIS — E785 Hyperlipidemia, unspecified: Secondary | ICD-10-CM

## 2018-06-23 DIAGNOSIS — I480 Paroxysmal atrial fibrillation: Secondary | ICD-10-CM

## 2018-06-23 DIAGNOSIS — I2510 Atherosclerotic heart disease of native coronary artery without angina pectoris: Secondary | ICD-10-CM

## 2018-06-23 DIAGNOSIS — F1011 Alcohol abuse, in remission: Secondary | ICD-10-CM

## 2018-06-23 DIAGNOSIS — I6529 Occlusion and stenosis of unspecified carotid artery: Secondary | ICD-10-CM

## 2018-06-23 NOTE — Assessment & Plan Note
He has never had typical angina symptoms.  I ordered a regadenoson thallium stress test to rule out any evidence of CAD progression.

## 2018-06-23 NOTE — Assessment & Plan Note
CT angiogram ordered for January.

## 2018-06-23 NOTE — Assessment & Plan Note
CT angiogram ordered for January.  No TIA symptoms currently.

## 2018-06-23 NOTE — Assessment & Plan Note
He will measure home blood pressure readings and bring his monitor in for verification.  I told him that the goal blood pressure is an average of 130/80 or less.

## 2018-06-23 NOTE — Assessment & Plan Note
He denies any palpitations or other symptoms that would suggest recurrent atrial fibrillation.

## 2018-06-23 NOTE — Progress Notes
Date of Service: 06/23/2018    Shane Roach is a 73 y.o. male.       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    He has had a little bit of fatigue lately and was concerned that he might be back in atrial fibrillation, but he has not had any palpitations.    He denies any chest discomfort and he has not had the type of breathlessness or fatigue that he had prior to his coronary stent procedure in 2017.    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  He has had absolutely no problems with any palpitations or lightheadedness.         Vitals:    06/23/18 0902 06/23/18 0910   BP: (!) 160/88 (!) 162/88   BP Source: Arm, Left Upper Arm, Right Upper   Pulse: 53    Weight: 68.5 kg (151 lb)    Height: 1.702 m (5' 7)    PainSc: Zero      Body mass index is 23.65 kg/m???.     Past Medical History  Patient Active Problem List    Diagnosis Date Noted   ??? 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 History of ETOH abuse 03/25/2017   ??? AKI (acute kidney injury) (HCC) 03/25/2017   ??? Encounter for monitoring sotalol therapy 03/25/2017   ??? Paroxysmal atrial fibrillation (HCC) 02/22/2017   ??? Screening for cardiovascular condition 07/23/2015     07/10/15:  [facility] Duplex [facility] normal.  No AAA.     ??? Hyperlipemia 04/23/2009     09/01/01 - New diagnosis.  Therapy not yet initiated.           ??? CAD (coronary artery disease) 04/23/2009     09/01/01 - Left-sided exertional  c/p radiating to throat/L arm.  Eplee Eval>CTchest/.PFT normal. LBBB EKG>>Dr. Jonelle Sports consult  09/06/01 - Cath: EF 50%,  bifurcation LAD/Dx disease, 90% D RCA, PDA,   09/07/01 - 4 Lesion PCI:  3.0 Cypher LAD,  2.5 Cypher Dx, 3.5 Cypher dRCA,  2.5 Express PDA Dr. Ludwig Lean  3/05 - Stress echo: EF 60%, LVH, LBBB, non-ischemic. 6/07 - LAD-LADD restenosis--PCI/stent -Ludwig Lean    07/17/15: Cardiac cath: LAD with 20% ISRS, Diagonal with 50% ISRS ( negative FFR from 0.92 --> 0.89), Lcx free of dz, RCA dominant with 20% mid, 90% ISRS to PDA. Successful DES placed.  30-40% RPW.  LVEDP 11 mmHg  ~ Dual antiplatelet therapy with Aspirin lifelong and Plavix 75 mg daily at least 6 months to 1 year w/o interruption to prevent stent thrombosis and possible myocardial infarction.        ??? Prostate CA (HCC) 04/23/2009     Seed implantation     ??? GI bleed 04/23/2009     Hx Gastric ulcer in 1997. No further issues. Tolerated DAPT in 2007 w/o issues.   ~ Dr. Lance Sell office called with no records of GI procedures.             ??? History of tobacco abuse 04/23/2009     8/03 - 1 ppd x 45 years     ??? LBBB (left bundle branch block) 04/23/2009   ??? 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  07/2014 - CTA showed stable disease     ??? Hypertension 04/23/2009   ??? Carotid atherosclerosis 11/22/2008     Carotid duplex 11/06/08 [facility]):  50-69% left external carotid.  <30% stenosis in the internal carotid arteries bilaterally  MRI/MRA head 11/06/08 [facility]):  Turbulent flow in the right internal carotid, but no significant stenosis.  No other vascular abnormalities noted.  Posterior circulation normal.     ??? Dizziness 11/22/2008     11/05/08:  Acute onset dizziness requiring hospitalization in [place].  MRI/MRA head unremarkable.  CT-head normal.  Discharged on meclizine with diagnosis labyrinthitis.           Review of Systems   Constitution: Negative. HENT: Negative.    Eyes: Negative.    Cardiovascular: Negative.    Respiratory: Negative.    Endocrine: Negative.    Hematologic/Lymphatic: Negative.    Skin: Negative.    Musculoskeletal: Negative.    Gastrointestinal: Negative.    Genitourinary: Negative.    Neurological: Negative.    Psychiatric/Behavioral: Negative.    Allergic/Immunologic: Negative.        Physical Exam    Physical Exam   General Appearance: no distress   Skin: warm, no ulcers or xanthomas   Digits and Nails: no cyanosis or clubbing   Eyes: conjunctivae and lids normal, pupils are equal and round   Teeth/Gums/Palate: dentition unremarkable, no lesions   Lips & Oral Mucosa: no pallor or cyanosis   Neck Veins: normal JVP , neck veins are not distended   Thyroid: no nodules, masses, tenderness or enlargement   Chest Inspection: chest is normal in appearance   Respiratory Effort: breathing comfortably, no respiratory distress   Auscultation/Percussion: lungs clear to auscultation, no rales or rhonchi, no wheezing   PMI: PMI not enlarged or displaced   Cardiac Rhythm: regular rhythm and normal rate   Cardiac Auscultation: S1, S2 normal, no rub, no gallop   Murmurs: no murmur   Peripheral Circulation: normal peripheral circulation   Carotid Arteries: normal carotid upstroke bilaterally, no bruits   Radial Arteries: normal symmetric radial pulses   Abdominal Aorta: no abdominal aortic bruit   Pedal Pulses: normal symmetric pedal pulses   Lower Extremity Edema: no lower extremity edema   Abdominal Exam: soft, non-tender, no masses, bowel sounds normal   Liver & Spleen: no organomegaly   Gait & Station: walks without assistance   Muscle Strength: normal muscle tone   Orientation: oriented to time, place and person   Affect & Mood: appropriate and sustained affect   Language and Memory: patient responsive and seems to comprehend information   Neurologic Exam: neurological assessment grossly intact   Other: moves all extremities Problems Addressed Today  Encounter Diagnoses   Name Primary?   ??? Right-sided aortic arch Yes   ??? Pure hypercholesterolemia    ??? Diverticulum of Kommerell    ??? Carotid atherosclerosis, unspecified laterality    ??? Paroxysmal atrial fibrillation (HCC)    ??? Coronary artery disease involving native heart with other form of angina pectoris, unspecified vessel or lesion type (HCC)    ??? Essential hypertension        Assessment and Plan  Hyperlipemia  Lab Results   Component Value Date    CHOL 114 03/26/2017    TRIG 63 03/26/2017    HDL 42 03/26/2017    LDL 67 03/26/2017    VLDL 13 03/26/2017    NONHDLCHOL 72 03/26/2017    CHOLHDLC 2 09/26/2014    CHOLHDLC 2 09/26/2014      LDL treated to goal, but I'll check to see if he has a more recent lipid profile.    Carotid atherosclerosis  CT angiogram ordered for January.  No TIA symptoms currently.    Diverticulum of Kommerell  CT angiogram ordered for January.    Paroxysmal atrial fibrillation (HCC)  He denies any palpitations or other symptoms that would suggest recurrent atrial fibrillation.    CAD (coronary artery disease)  He has never had typical angina symptoms.  I ordered a regadenoson thallium stress test to rule out any evidence of CAD progression.    Hypertension  He will measure home blood pressure readings and bring his monitor in for verification.  I told him that the goal blood pressure is an average of 130/80 or less.      Current Medications (including today's revisions)  ??? aspirin EC 81 mg tablet Take 1 Tab by mouth daily.   ??? atorvastatin (LIPITOR) 80 mg tablet Take 80 mg by mouth daily. Indications: pt states he takes this in the morning   ??? cetirizine (ZYRTEC) 10 mg tablet Take 10 mg by mouth daily.   ??? ELIQUIS 5 mg tablet TAKE 1 TABLET BY MOUTH TWICE DAILY   ??? lisinopril (PRINIVIL; ZESTRIL) 10 mg tablet Take one tablet by mouth daily.   ??? metoprolol tartrate (LOPRESSOR) 25 mg tablet Take 1.5 tablets by mouth twice daily. ??? vitamins, multiple Cap Take 1 Cap by mouth Daily.

## 2018-06-23 NOTE — Assessment & Plan Note
Lab Results   Component Value Date    CHOL 114 03/26/2017    TRIG 63 03/26/2017    HDL 42 03/26/2017    LDL 67 03/26/2017    VLDL 13 03/26/2017    NONHDLCHOL 72 03/26/2017    CHOLHDLC 2 09/26/2014    Concepcion 2 09/26/2014      LDL treated to goal, but I'll check to see if he has a more recent lipid profile.

## 2018-07-01 ENCOUNTER — Ambulatory Visit: Admit: 2018-07-01 | Discharge: 2018-07-02

## 2018-07-01 ENCOUNTER — Encounter: Admit: 2018-07-01 | Discharge: 2018-07-01

## 2018-07-01 DIAGNOSIS — E7800 Pure hypercholesterolemia, unspecified: Secondary | ICD-10-CM

## 2018-07-06 ENCOUNTER — Encounter: Admit: 2018-07-06 | Discharge: 2018-07-06

## 2018-07-06 IMAGING — US ABDLM
1 series · 13 of 25 positions shown · non-contrast
Comparison: none

[Series 1: us abdomen limited · 13 of 91 slices shown]
[im 1/91]
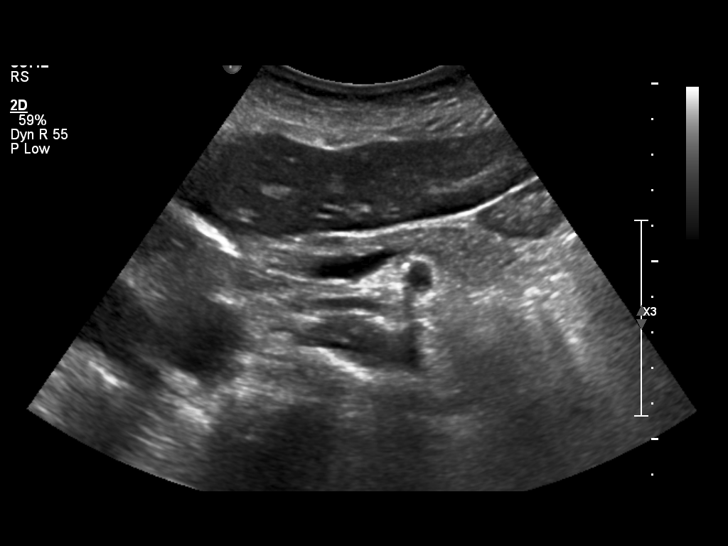
[im 8/91]
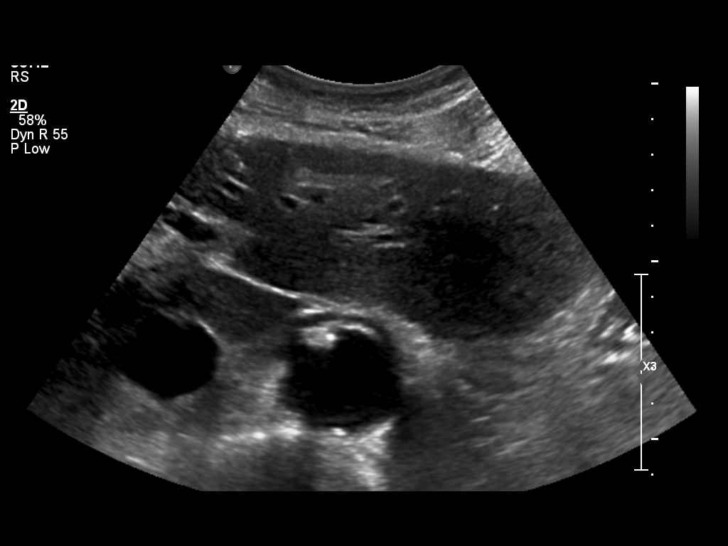
[im 16/91]
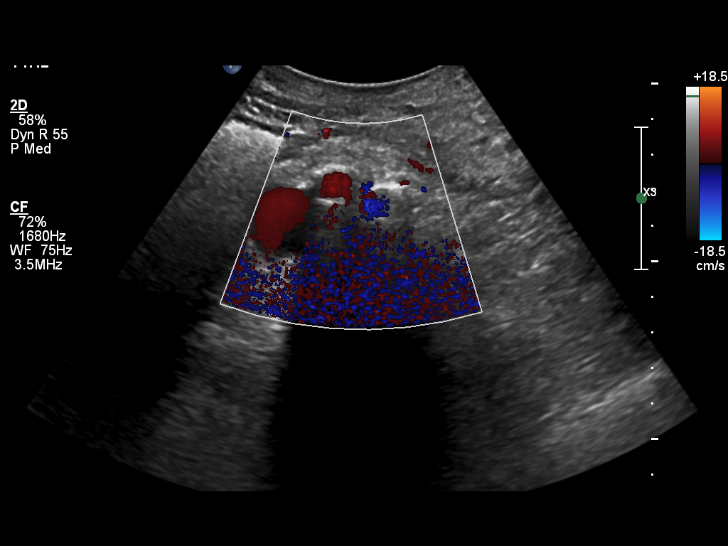
[im 23/91]
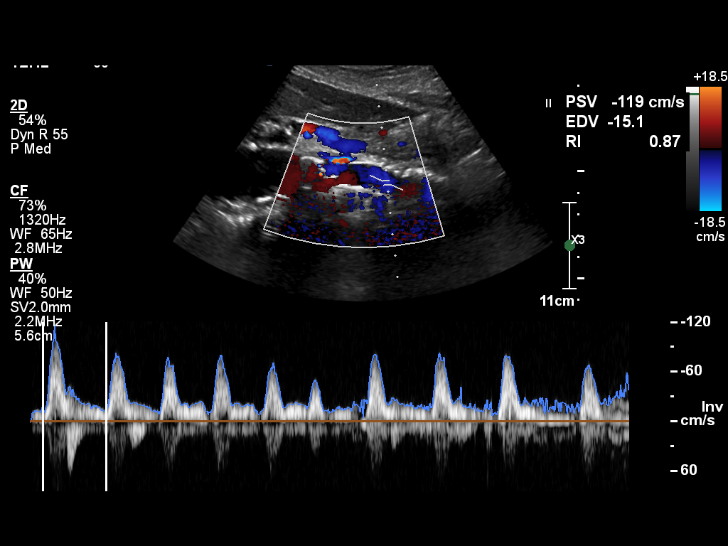
[im 31/91]
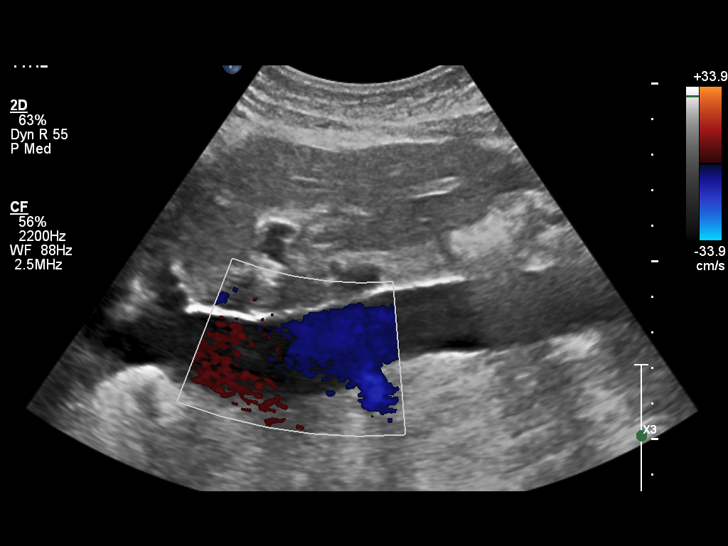
[im 38/91]
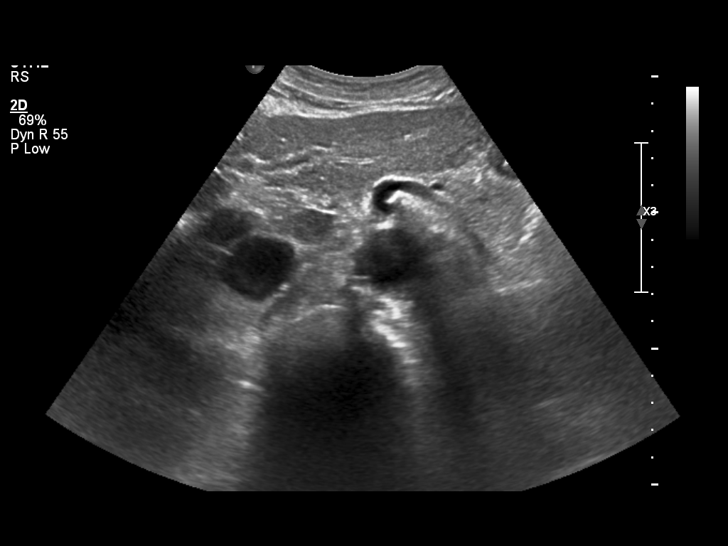
[im 46/91]
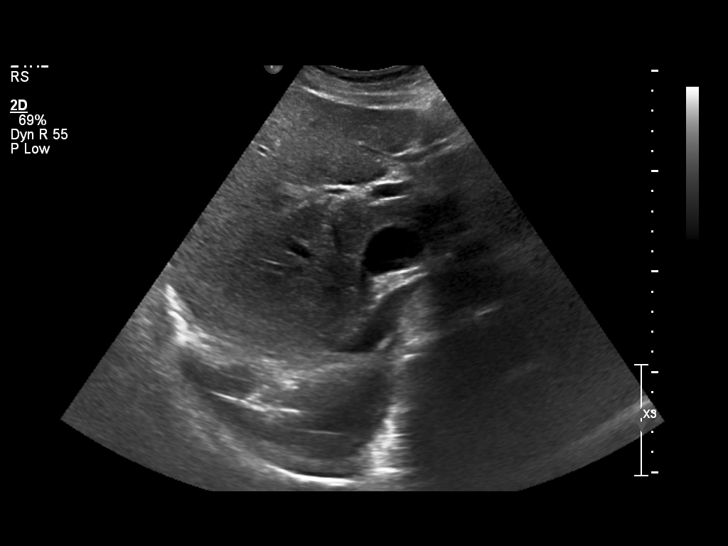
[im 53/91]
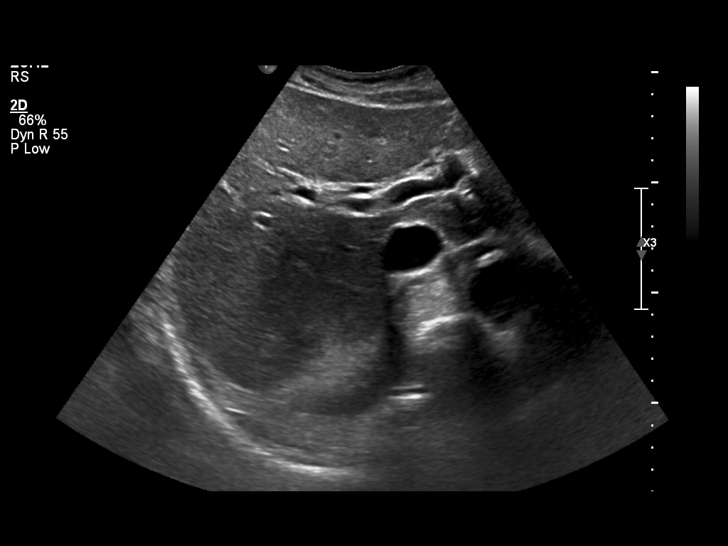
[im 61/91]
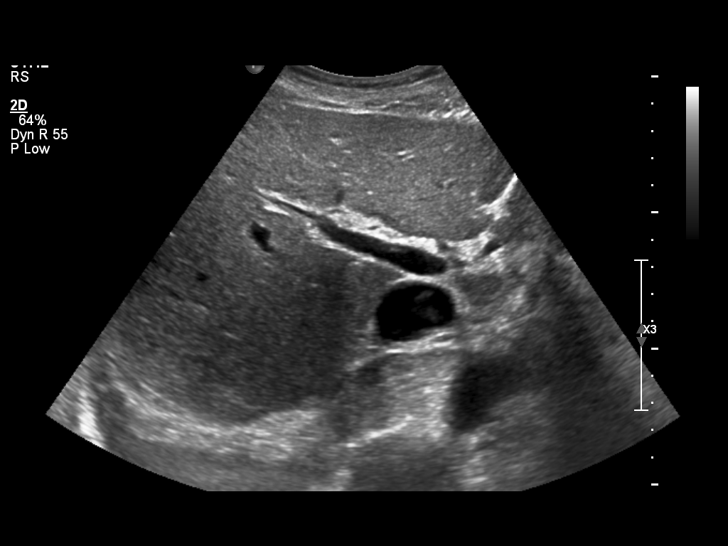
[im 68/91]
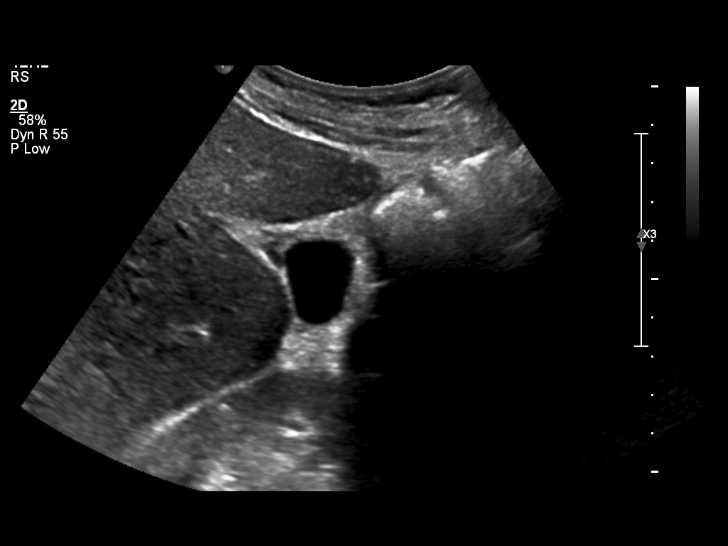
[im 76/91]
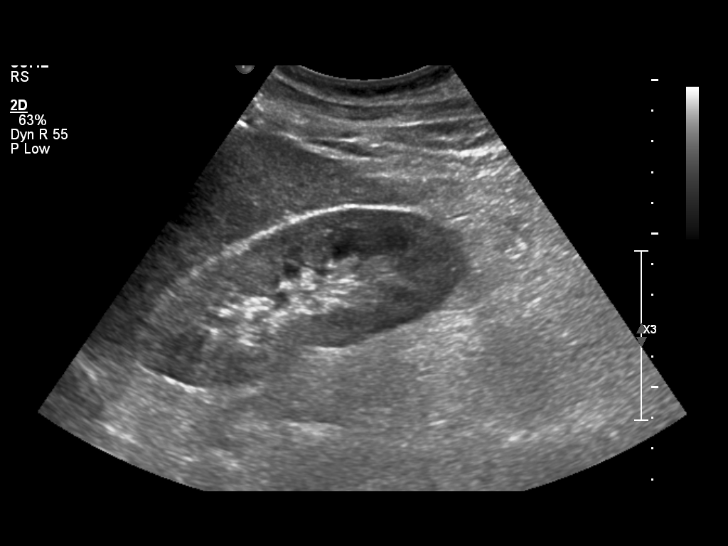
[im 83/91]
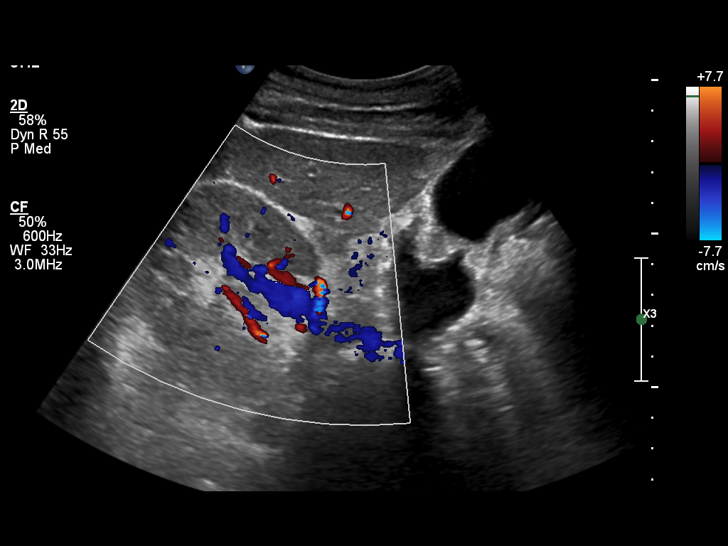
[im 91/91]
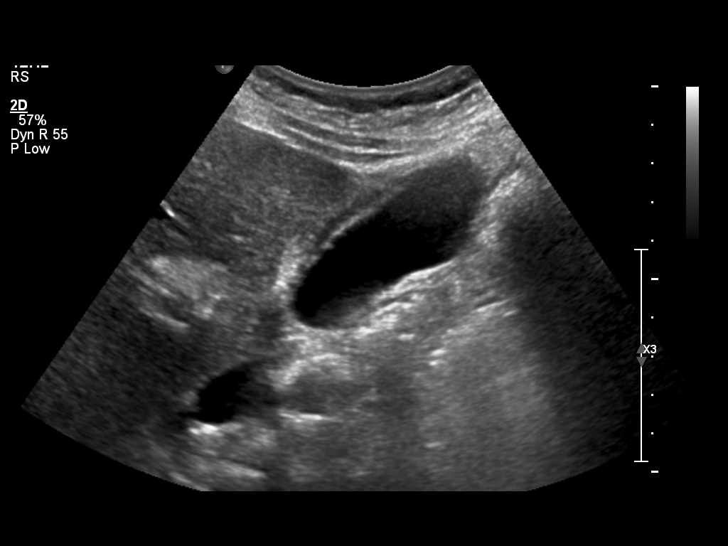

[13 of 25 positions shown; findings below may reference images not displayed]

ULTRASOUND REPORT

EXAM

Right upper quadrant abdomen ultrasound.

INDICATION

hepatomegaly
TM

TECHNIQUE

High-resolution grayscale and color Doppler interrogation of the right upper quadrant.

COMPARISONS

No prior studies are available for comparison.

FINDINGS

The liver is homogeneous in echogenicity and measures 14.6 centimeters in length. No intrahepatic
biliary ductal dilatation or mass.

The IVC is patent. There is prominence of the abdominal aorta which measures up to 3.5 x
centimeters in diameter with atherosclerotic plaque also appreciated. Peak systolic velocity
measurements of the abdominal aorta are well-maintained.

The gallbladder is incompletely distended and shows no evidence of cholelithiasis. There is no
compelling evidence of gallbladder wall thickening.

The common bile duct is normal measuring 3 millimeters in diameter.

The pancreas as imaged is unremarkable without ductal dilatation or mass.

The right kidney measures 11.7 centimeters in length and shows no evidence of hydronephrosis.

There is a right pleural effusion.

IMPRESSION

Mild aneurysmal dilatation of the proximal abdominal aorta. No compelling evidence of acute
gallbladder pathology. There is no ultrasonographic evidence of hepatomegaly or hepatic mass.

## 2018-07-06 NOTE — Telephone Encounter
I called to check on Shane Roach's b/p's. He states they are up and down, but for the most part the systolic is 116-579'U, although he has had a couple that were 120's.   Diastolic is good in the 38'B

## 2018-07-08 ENCOUNTER — Encounter: Admit: 2018-07-08 | Discharge: 2018-07-08

## 2018-07-08 DIAGNOSIS — I10 Essential (primary) hypertension: Secondary | ICD-10-CM

## 2018-07-08 DIAGNOSIS — Z1159 Encounter for screening for other viral diseases: Secondary | ICD-10-CM

## 2018-07-08 DIAGNOSIS — R9439 Abnormal result of other cardiovascular function study: Secondary | ICD-10-CM

## 2018-07-08 DIAGNOSIS — I25118 Atherosclerotic heart disease of native coronary artery with other forms of angina pectoris: Secondary | ICD-10-CM

## 2018-07-08 DIAGNOSIS — I6529 Occlusion and stenosis of unspecified carotid artery: Secondary | ICD-10-CM

## 2018-07-08 MED ORDER — ASPIRIN 325 MG PO TAB
325 mg | Freq: Once | ORAL | 0 refills | Status: CN
Start: 2018-07-08 — End: ?

## 2018-07-08 MED ORDER — NITROGLYCERIN 0.3 MG SL SUBL
.4 mg | SUBLINGUAL | 0 refills | Status: CN | PRN
Start: 2018-07-08 — End: ?

## 2018-07-08 MED ORDER — ACETAMINOPHEN 325 MG PO TAB
650 mg | ORAL | 0 refills | Status: CN | PRN
Start: 2018-07-08 — End: ?

## 2018-07-08 MED ORDER — ALUMINUM-MAGNESIUM HYDROXIDE 200-200 MG/5 ML PO SUSP
30 mL | ORAL | 0 refills | Status: CN | PRN
Start: 2018-07-08 — End: ?

## 2018-07-08 MED ORDER — TEMAZEPAM 7.5 MG PO CAP
15 mg | Freq: Every evening | ORAL | 0 refills | Status: CN | PRN
Start: 2018-07-08 — End: ?

## 2018-07-08 MED ORDER — DOCUSATE SODIUM 100 MG PO CAP
100 mg | Freq: Every day | ORAL | 0 refills | Status: CN | PRN
Start: 2018-07-08 — End: ?

## 2018-07-08 NOTE — Progress Notes
CARDIAC CATHETERIZATION   PRE-ADMISSION INSTRUCTIONS    Patient Name: Shane Roach  MRN#: [id number]  Date of Birth: 05-May-1945 (73 y.o.)  Today's Date: 07/08/2018    PROCEDURE:  You are scheduled for a Coronary Angiogram with possible Angioplasty/Stenting with Dr. Greig Castilla.    PROCEDURE DATE AND ARRIVAL TIME:    Your procedure date is 07/14/18.  You will receive a call from the Cath lab staff between 8:00 a.m. and noon on the business day prior to your procedure to let you know at what time to arrive on the day of your procedure.    Please check in at the Admitting Desk in the [facility] for your procedure. [facility] Entrance and and take a right. Continue down the hallway past [facility] office. That hall will take you into the [facility]. Check in at the desk on the left side.)     (If you have further questions regarding your arrival time for the CV lab, please call [phone] by 3:00pm the day before your procedure. Please leave a message with your name and number, your call will be returned in a timely manner.)    PRE-PROCEDURE APPOINTMENTS:       Pre-Admission lab work required within 14 days of procedure: BMP and CBC at the lab of your choice.         SPECIAL MEDICATION INSTRUCTIONS  Nothing to eat after midnight before your procedure. No caffeine for 24 hours prior to your procedure. You will be under moderate sedation for your procedure.  You may drink clear liquids up to an hour before hospital arrival. This will be confirmed by the Cath lab staff the day before your procedure.        Any new prescriptions will be sent to your pharmacy listed on file with [place].   HOLD ALL over the counter vitamins or supplements on the morning of your procedure.  Please either take 4 baby aspirins (4 times 81mg ) or one full strength NON-COATED 325mg  aspirin.  apixaban (Eliquis): hold 3 doses before your procedure.        Additional Instructions If you wear CPAP, please bring your mask and machine with you to the hospital.    Take a bath or shower with anti-bacterial soap the evening before, or the morning of the procedure.     Bring photo ID and your health insurance card(s).    Arrange for a driver to take you home from the hospital. Please arrange for a friend or family member to take you home from this test. You cannot take a Taxi, [employer] Goad, or public transportation as there has to be a responsible person to help care for you after sedation    Bring an accurate list of your current medications with you to the hospital (all medications and supplements taken daily).  Please use the medication list below and write in the date and time when you took your last dose before your procedure. Update this list of medications as needed.      Wear comfortable clothes and don't bring valuables, other than photo identification card, with you to the hospital.    Please pack a bag for an overnight stay.     Please review your pre-procedure instructions and bring them with you on the day of your procedure.  Call the office at [phone] with any questions. You may ask to speak with Dr. Reola Mosher nurse.        ALLERGIES  No Known Allergies    CURRENT MEDICATIONS   Luera, Keith L   Home Medication Instructions HAR:    Printed on:07/08/18 1746   Medication Information                      aspirin EC 81 mg tablet  Take 1 Tab by mouth daily.             atorvastatin (LIPITOR) 80 mg tablet  Take 80 mg by mouth daily. Indications: pt states he takes this in the morning             cetirizine (ZYRTEC) 10 mg tablet  Take 10 mg by mouth daily.             ELIQUIS 5 mg tablet  TAKE 1 TABLET BY MOUTH TWICE DAILY             lisinopril (PRINIVIL; ZESTRIL) 10 mg tablet  Take one tablet by mouth daily.             metoprolol tartrate (LOPRESSOR) 25 mg tablet  Take 1.5 tablets by mouth twice daily.             vitamins, multiple Cap  Take 1 Cap by mouth Daily. _________________________________________  Form completed by: Weston Brass  Date completed: 07/08/18  Method: Via telephone and mailed to the patient.

## 2018-07-11 ENCOUNTER — Encounter: Admit: 2018-07-11 | Discharge: 2018-07-11

## 2018-07-11 DIAGNOSIS — I480 Paroxysmal atrial fibrillation: Secondary | ICD-10-CM

## 2018-07-11 DIAGNOSIS — E782 Mixed hyperlipidemia: Secondary | ICD-10-CM

## 2018-07-11 DIAGNOSIS — I2510 Atherosclerotic heart disease of native coronary artery without angina pectoris: Secondary | ICD-10-CM

## 2018-07-11 NOTE — Progress Notes
website with [employer], [email or web address], confirmed benefits and eligibility:  Current and active since 01/12/2018, $315 copayment to the hospital to max OOP $6200, then plan will pay 100% of allowable charges.  Pre-certification through [employer], [phone], is required for [place] [id number]. Reference #07/11/2018 11:00    Katie, cardiology review nurse for [employer], after extensive clinical review gave approval for procedure, valid for a single date of service between 07/11/2018 - 01/07/2019.  Authorization #[id number]

## 2018-07-12 NOTE — Progress Notes
COVID testing done at the [facility] on 6/29.  Results pending.  Will call for results on 7/1.

## 2018-07-13 NOTE — Progress Notes
Richardson Landry, RN called to obtain COVID results and pt did not have COVID testing completed as scheduled according to Dr. Tobey Grim office.   Richardson Landry, RN will f/u to see if okay to proceed without testing.

## 2018-07-16 ENCOUNTER — Encounter: Admit: 2018-07-16 | Discharge: 2018-07-16

## 2018-07-18 LAB — BASIC METABOLIC PANEL
Lab: 0.9
Lab: 107
Lab: 139
Lab: 17
Lab: 26
Lab: 4.4
Lab: 97

## 2018-07-18 LAB — CBC
Lab: 13
Lab: 149
Lab: 15
Lab: 30
Lab: 32
Lab: 5.2
Lab: 92

## 2018-07-19 ENCOUNTER — Encounter: Admit: 2018-07-19 | Discharge: 2018-07-19

## 2018-07-19 DIAGNOSIS — I6529 Occlusion and stenosis of unspecified carotid artery: Secondary | ICD-10-CM

## 2018-07-19 DIAGNOSIS — I10 Essential (primary) hypertension: Secondary | ICD-10-CM

## 2018-07-19 DIAGNOSIS — I25118 Atherosclerotic heart disease of native coronary artery with other forms of angina pectoris: Secondary | ICD-10-CM

## 2018-07-20 ENCOUNTER — Encounter: Admit: 2018-07-20 | Discharge: 2018-07-20

## 2018-07-20 DIAGNOSIS — Z1159 Encounter for screening for other viral diseases: Secondary | ICD-10-CM

## 2018-07-20 LAB — COVID-19 (SARS-COV-2) PCR
Lab: NEGATIVE
Lab: NEGATIVE

## 2018-07-20 NOTE — Progress Notes
Cardiovascular Labs Scheduling Checklist   1. Date of procedure:07/09    2. Arrival time:1000    3. Patient instructed NPO after MN; clear liquids until: 0900    4. Instructed to check-in at the heart hospital registration desk and bring photo id, insurance cards and a current list of home medications.  Pack an overnight bag in the event you are admitted overnight:Y     5. If you have a history of sleep apnea and use a C-Pap or Bi-Pap machine, please bring it with you to the hospital:N    6. Have a driver available upon discharge as you may not be cleared to drive for 48 hours or more post procedure. (exception is RHC/biopsy patient with internal jugular approach not receiving sedation): Y    7. If the patient's language preference is other than English, please indicated native language and need for interpreter:  N    8. Patient instructed to drink 64 oz water the day before their procedure if applicable and not contraindicated: Y    9. Patient instructed no caffeine for 24 hours before procedure: Y    10. Pre-procedure medications reviewed: Y  Hold the following Medications: Continue to take the following:                     11. Contrast allergy with need for contrast allergy prophylaxis: N    12. Patient instructed to bath with antibacterial soap or surgical scrub:Y    13. List all same day pre-procedure requirements, i.e. Labs/H&P/EKG:    14. List any same day pre-procedure appointments:N    15. List any isolation precautions and organism:N    16. List any special considerations: N    17. Patient instructed to prepare for delays as there may be urgent or emergent cases: Y    18. Updated visitor guidelines reviewed  (1 visitor, only allowed in waiting room):Y    19. Patient acknowledges understanding of pre-procedure instructions: Y [place] TESTING DONE 07/09 AT [facility]

## 2018-07-20 NOTE — Telephone Encounter
Attempted to call patient to discuss negative COVID-19 test result; no answer, left VM directing patient to MyChart, as patient is active on MyChart. RN sent MyChart message notifying patient of result.    Gilverto Dileonardo, RN

## 2018-07-21 ENCOUNTER — Ambulatory Visit: Admit: 2018-07-21 | Discharge: 2018-07-21

## 2018-07-21 ENCOUNTER — Encounter: Admit: 2018-07-21 | Discharge: 2018-07-21

## 2018-07-21 DIAGNOSIS — C61 Malignant neoplasm of prostate: Secondary | ICD-10-CM

## 2018-07-21 DIAGNOSIS — R9439 Abnormal result of other cardiovascular function study: Principal | ICD-10-CM

## 2018-07-21 DIAGNOSIS — I10 Essential (primary) hypertension: Secondary | ICD-10-CM

## 2018-07-21 DIAGNOSIS — F1011 Alcohol abuse, in remission: Secondary | ICD-10-CM

## 2018-07-21 DIAGNOSIS — I447 Left bundle-branch block, unspecified: Secondary | ICD-10-CM

## 2018-07-21 DIAGNOSIS — Z720 Tobacco use: Secondary | ICD-10-CM

## 2018-07-21 DIAGNOSIS — E785 Hyperlipidemia, unspecified: Secondary | ICD-10-CM

## 2018-07-21 DIAGNOSIS — Q2549 Other congenital malformations of aorta: Secondary | ICD-10-CM

## 2018-07-21 DIAGNOSIS — I2510 Atherosclerotic heart disease of native coronary artery without angina pectoris: Secondary | ICD-10-CM

## 2018-07-21 DIAGNOSIS — I2583 Coronary atherosclerosis due to lipid rich plaque: Secondary | ICD-10-CM

## 2018-07-21 LAB — LIPID PROFILE
Lab: 10 mg/dL
Lab: 117 mg/dL (ref <200)
Lab: 49 mg/dL (ref <150)
Lab: 52 mg/dL (ref >40)
Lab: 53 mg/dL (ref <100)
Lab: 65 mg/dL

## 2018-07-21 MED ORDER — TEMAZEPAM 15 MG PO CAP
15 mg | Freq: Every evening | ORAL | 0 refills | Status: DC | PRN
Start: 2018-07-21 — End: 2018-07-22

## 2018-07-21 MED ORDER — SODIUM CHLORIDE 0.9 % IV SOLP
250 mL | INTRAVENOUS | 0 refills | Status: CP
Start: 2018-07-21 — End: ?
  Administered 2018-07-21: 16:00:00 250 mL via INTRAVENOUS

## 2018-07-21 MED ORDER — ALUMINUM-MAGNESIUM HYDROXIDE 200-200 MG/5 ML PO SUSP
30 mL | ORAL | 0 refills | Status: DC | PRN
Start: 2018-07-21 — End: 2018-07-22

## 2018-07-21 MED ORDER — NITROGLYCERIN 0.4 MG SL SUBL
.4 mg | SUBLINGUAL | 0 refills | Status: DC | PRN
Start: 2018-07-21 — End: 2018-07-22

## 2018-07-21 MED ORDER — ACETAMINOPHEN 325 MG PO TAB
650 mg | ORAL | 0 refills | Status: DC | PRN
Start: 2018-07-21 — End: 2018-07-22

## 2018-07-21 MED ORDER — DIPHENHYDRAMINE HCL 25 MG PO CAP
25 mg | ORAL | 0 refills | Status: DC | PRN
Start: 2018-07-21 — End: 2018-07-22

## 2018-07-21 MED ORDER — DOCUSATE SODIUM 100 MG PO CAP
100 mg | Freq: Every day | ORAL | 0 refills | Status: DC | PRN
Start: 2018-07-21 — End: 2018-07-22

## 2018-07-21 MED ORDER — ASPIRIN 325 MG PO TAB
325 mg | Freq: Once | ORAL | 0 refills | Status: DC
Start: 2018-07-21 — End: 2018-07-22

## 2018-07-21 MED ORDER — ONDANSETRON HCL (PF) 4 MG/2 ML IJ SOLN
4 mg | INTRAVENOUS | 0 refills | Status: DC | PRN
Start: 2018-07-21 — End: 2018-07-22

## 2018-07-21 MED ORDER — SODIUM CHLORIDE 0.9 % IV SOLP
1000 mL | INTRAVENOUS | 0 refills | Status: DC
Start: 2018-07-21 — End: 2018-07-22
  Administered 2018-07-21: 16:00:00 1000 mL via INTRAVENOUS

## 2018-07-21 MED ORDER — DIPHENHYDRAMINE HCL 50 MG/ML IJ SOLN
25 mg | INTRAVENOUS | 0 refills | Status: DC | PRN
Start: 2018-07-21 — End: 2018-07-22

## 2018-07-21 NOTE — Progress Notes
Patient arrived on unit via ambulation accompanied by RN. Patient transferred to the bed without assistance. Frailty score equals 3  Assessment completed, refer to flowsheet for details. Orders released, reviewed, and implemented as appropriate. Oriented to surroundings, call light within reach. Plan of care reviewed.  Will continue to monitor and assess.    2

## 2018-07-21 NOTE — Progress Notes
Patient presents for cardiac procedure. Please see previously completed H&P below.    Newton Pigg, APRN-C (pager [phone])    ______________________________________________     Shane Lesch APRN-NP   Nurse Practitioner   Cardiology-Interventional   Progress Notes   Signed                      Cardiology Pre Procedure Progress Note   ???  Procedure Date: 07/21/2018   ???  Planned Procedure(s): Left heart catheterization possible PCI  ???  Indication:   Abnormal stress test  ___________________________________________________________________________________________________________________________________  ???  Chief Complaint: Abnormal stress test  ???  Assessment and Plan:   Abnormal thallium MPI  Coronary artery disease   Hypertension  Paroxysmal atrial fibrillation  Hyperlipidemia  ???  Patient presented to Dr. Barry Dienes for routine evaluation and thallium MPI was ordered given his history of coronary artery disease.  This was done on 07/01/18 and was abnormal revealing evidence of RCA distribution ischemia.  He was noted to have mildly depressed LV systolic function with an EF of 44% with inferior wall, inferolateral and inferoseptal hypokinesis.  He was therefore referred for cardiac catheterization to further define his coronary anatomy.  The procedure as well as the risks and benefits were explained to the patient at length and he is willing to proceed as directed.   ???  Shane Lesch, APRN-NP  Interventional Cardiology   Pager [phone]  ???  ____________________________________________________________________________________________________________________________________  ???  History of Present Illness: Shane Roach is a 73 y.o. male with a history of coronary artery disease with previous PCI (LAD diagonal bifurcation in 2017), diverticulum of Kommerell, hypertension, carotid atherosclerosis, hyperlipidemia, prior tobacco abuse with cessation in 2013, COPD and paroxysmal atrial fibrillation.  He follows with Dr. Barry Dienes and was evaluated by him on June 11 for routine follow-up with report of fatigue and was concerned his A fib had come back. He denies having any chest discomfort or shortness of air or fatigue prior to his past stent in 2017. Given that he has never had typical angina symptoms he was referred for routine regadenoson thallium MPI to rule out progression of coronary artery disease.  This was done on 07/01/18 and was abnormal revealing evidence of RCA distribution ischemia.  LVEF mildly depressed at 44% with inferior wall, inferolateral and inferoseptal hypokinesis.  He was therefore referred for cardiac catheterization to further define his coronary anatomy.  ???  Patient endorses progressive fatigue. 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 He finds himself becoming worn out quickly and has developed heat intolerance. He denies excessive diaphoresis but finds it more challenging to breath in the heat. He quit smoking ~ 7 yrs ago and blames breathing on untreated COPD. He denies chest pain, chest heaviness,orthopnea, paroxysmal nocturnal dyspnea, syncope, near syncope, fever, chills, or N/V/D.   ???  ???       Patient Active Problem List   ??? Diagnosis Date Noted   ??? 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 History of ETOH abuse 03/25/2017   ??? AKI (acute kidney injury) (HCC) 03/25/2017   ??? Encounter for monitoring sotalol therapy 03/25/2017   ??? Paroxysmal atrial fibrillation (HCC) 02/22/2017   ??? Screening for cardiovascular condition 07/23/2015   ??? Hyperlipemia 04/23/2009   ??? CAD (coronary artery disease) 04/23/2009   ??? Prostate CA (HCC) 04/23/2009   ??? GI bleed 04/23/2009   ??? History of tobacco  abuse 04/23/2009   ??? LBBB (left bundle branch block) 04/23/2009   ??? 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 Hypertension 04/23/2009   ??? Carotid atherosclerosis 11/22/2008   ??? Dizziness 11/22/2008   ???       Medical History:   Diagnosis Date   ??? Coronary artery disease ??? ??? Diverticulum of Kommerell ???   ??? History of ETOH abuse 03/25/2017   ??? Hyperlipidemia ???   ??? Hypertension ???   ??? LBBB (left bundle branch block) 04/23/2009   ??? Prostate cancer (HCC) ???   ??? Tobacco abuse 04/23/2009            Surgical History:   Procedure Laterality Date   ??? Left Heart Catheterization With Ventriculogram Left 07/17/2015   ??? Performed by Marcell Barlow, MD, [facility] at [facility] CATH LAB   ??? Coronary Angiography N/A 07/17/2015   ??? Performed by Marcell Barlow, MD, [facility] at [facility] CATH LAB   ??? Possible Percutaneous Coronary Intervention N/A 07/17/2015   ??? Performed by Marcell Barlow, MD, [facility] at [facility] CATH LAB   ??? INTRACARDIAC CATHETER ABLATION WITH COMPREHENSIVE ELECTROPHYSIOLOGIC EVALUATION - ATRIAL FIBRILLATION Left 06/14/2017   ??? Performed by Jen Mow, MD at [facility] EP LAB   ??? TRANSESOPHAGEAL ECHOCARDIOGRAM DURING INTERVENTION N/A 06/14/2017   ??? Performed by Cath, Physician at [facility] EP LAB      Prescriptions Prior to Admission           Medications Prior to Admission   Medication Sig Dispense Refill Last Dose   ??? aspirin EC 81 mg tablet Take 1 Tab by mouth daily. 90 Tab 3 07/21/2018   ??? atorvastatin (LIPITOR) 80 mg tablet Take 80 mg by mouth daily. Indications: pt states he takes this in the morning ??? ??? 07/20/2018   ??? cetirizine (ZYRTEC) 10 mg tablet Take 10 mg by mouth daily. ??? ??? 07/20/2018   ??? ELIQUIS 5 mg tablet TAKE 1 TABLET BY MOUTH TWICE DAILY 60 tablet 11 07/19/2018   ??? lisinopril (PRINIVIL; ZESTRIL) 10 mg tablet Take one tablet by mouth daily. 90 tablet 3 07/21/2018   ??? metoprolol tartrate (LOPRESSOR) 25 mg tablet Take 1.5 tablets by mouth twice daily. 270 tablet 3 07/21/2018   ??? vitamins, multiple Cap Take 1 Cap by mouth Daily. ??? ??? 07/20/2018      ???  No Known Allergies  ???  ???  No current facility-administered medications on file prior to encounter.    ???         Current Outpatient Medications on File Prior to Encounter   Medication Sig Dispense Refill   ??? aspirin EC 81 mg tablet Take 1 Tab by mouth daily. 90 Tab 3 ??? atorvastatin (LIPITOR) 80 mg tablet Take 80 mg by mouth daily. Indications: pt states he takes this in the morning ??? ???   ??? cetirizine (ZYRTEC) 10 mg tablet Take 10 mg by mouth daily. ??? ???   ??? ELIQUIS 5 mg tablet TAKE 1 TABLET BY MOUTH TWICE DAILY 60 tablet 11   ??? lisinopril (PRINIVIL; ZESTRIL) 10 mg tablet Take one tablet by mouth daily. 90 tablet 3   ??? metoprolol tartrate (LOPRESSOR) 25 mg tablet Take 1.5 tablets by mouth twice daily. 270 tablet 3   ??? vitamins, multiple Cap Take 1 Cap by mouth Daily. ??? ???   ???  ???  Social History:   Social History   ???       Tobacco Use   ??? Smoking status:  Former Smoker   ??? Smokeless tobacco: Never Used   Substance Use Topics   ??? Alcohol use: No      No family history on file.   ???  Review of Systems  General: fatigue, heat intolerance   Eyes:??? negative/normal.  Ears/Nose/Throat:??? negative/normal.  Cardiovascular:??? negative/normal.  Respiratory: SOA in the heat   Gastrointestinal:??? negative/normal.  Genitourinary:??? negative/normal.  Musculoskeletal:??? negative/normal.  Skin: negative/normal.   Neurologic:??? negative/normal.  Psychiatric:??? negative/normal.  Endocrine:??? negative/normal.  Heme/Lymphatic: negative/normal.  Allergic/Immunologic:??? negative/normal.  ???  ???  Physical Exam:  Vital Signs: Last Filed In 24 Hours Vital Signs: 24 Hour Range   BP: 161/96 (07/09 1100)  Temp: 36.6 ???C (97.9 ???F) (07/09 1028)  Pulse: 54 (07/09 1100)  Respirations: 17 PER MINUTE (07/09 1100)  SpO2: 98 % (07/09 1100)  SpO2 Pulse: 54 (07/09 1100)  Height: 170.2 cm (5' 7) (07/09 1028) BP: (154-161)/(83-96)   Temp:  [36.6 ???C (97.9 ???F)]   Pulse:  [54]   Respirations:  [13 PER MINUTE-17 PER MINUTE]   SpO2:  [98 %-99 %]     ???   ???     Gen: appears stated age, in no acute distress  Head: normocephalic, atraumatic  Eyes: sclera non-icteric, EOMs intact   Mouth: mucous membranes are moderately moist  Lungs: CTA bilaterally without rales or rhonchi  Heart: S1, S2  Abdomen: soft, nontender, bowel sounds present Extremities: no C/C/E, pedal pulses are intact  Skin: warm and dry  Neurological: A&Ox3, no focal deficits noted  Psychiatric: calm, pleasant, and cooperative  ???  ???  Lab/Radiology/Other Diagnostic Tests:  Labs:    Hematology:          Lab Results   Component Value Date   ??? HGB 15.9 07/18/2018   ??? HCT 48.7 07/18/2018   ??? PLTCT 149 07/18/2018   ??? WBC 5.0 07/18/2018   ??? NEUT 67 06/11/2005   ??? ANC 4.90 06/11/2005   ??? ALC 1.73 06/11/2005   ??? MONA 8 06/11/2005   ??? AMC 0.55 06/11/2005   ??? ABC 0.03 06/11/2005   ??? MCV 92.4 07/18/2018   ??? MCHC 32.6 07/18/2018   ??? MPV 8.9 06/15/2017   ??? RDW 13.3 07/18/2018   , General Chemistry:          Lab Results   Component Value Date   ??? NA 139 07/18/2018   ??? K 4.4 07/18/2018   ??? CL 107 07/18/2018   ??? GAP 10 07/18/2018   ??? BUN 17.0 07/18/2018   ??? CR 0.97 07/18/2018   ??? GLU 97 07/18/2018   ??? GLU 112 01/01/2006   ??? CA 9.3 07/18/2018   ??? ALBUMIN 3.8 02/18/2017   ??? MG 1.7 06/15/2017   ??? TOTBILI 1.40 02/18/2017   , HgbA1C: No results found for: HGBA1C and Lipid Profile:         Lab Results   Component Value Date   ??? CHOL 114 03/26/2017   ??? TRIG 63 03/26/2017   ??? HDL 42 03/26/2017   ??? LDL 67 03/26/2017   ??? VLDL 13 03/26/2017   ???  ???  ???   ???     ???  ???      Revision History

## 2018-07-21 NOTE — Progress Notes
Patient discharged to home with all belongings.  Discharge instructions, med reconciliation and home wound care instructions given and explained to patient and family both verbally and written.  Accompanied by family.  No complaints of pain or discomfort.   Puncture wound  remains clean, dry, and intact with no evidence of a hematoma after ambulation.  Patient escorted to lobby via Family.  Patient to follow up with [facility]) or on-call physician with any additional questions or concerns.  All contact numbers provided.  Patient and family acceptant of DC instuctions and report understanding to all information.

## 2018-07-21 NOTE — Discharge Instructions - Pharmacy
Physician Discharge Summary      Name: Shane Roach  Medical Record Number: [id number]        Account Number:  [id number]  Date Of Birth:  01-12-1946                         Age:  73 years   Admit date:  07/21/2018                     Discharge date:  07/21/2018    Attending Physician:  Dr. Greig Castilla, MD               Service: Med-Cardiovasc    Physician Summary completed by: Jearld Lesch, APRN-NP     Reason for hospitalization: left cardiac catheterization      Significant PMH:   Medical History:   Diagnosis Date   ??? Coronary artery disease    ??? Diverticulum of Kommerell    ??? History of ETOH abuse 03/25/2017   ??? Hyperlipidemia    ??? Hypertension    ??? LBBB (left bundle branch block) 04/23/2009   ??? Prostate cancer (HCC)    ??? Tobacco abuse 04/23/2009       Allergies: Patient has no known allergies.    Admission Lab/Radiology studies notable for:   Hematology:    Lab Results   Component Value Date    HGB 15.9 07/18/2018    HCT 48.7 07/18/2018    PLTCT 149 07/18/2018    WBC 5.0 07/18/2018    NEUT 67 06/11/2005    ANC 4.90 06/11/2005    ALC 1.73 06/11/2005    MONA 8 06/11/2005    AMC 0.55 06/11/2005    ABC 0.03 06/11/2005    MCV 92.4 07/18/2018    MCHC 32.6 07/18/2018    MPV 8.9 06/15/2017    RDW 13.3 07/18/2018   , General Chemistry:    Lab Results   Component Value Date    NA 139 07/18/2018    K 4.4 07/18/2018    CL 107 07/18/2018    GAP 10 07/18/2018    BUN 17.0 07/18/2018    CR 0.97 07/18/2018    GLU 97 07/18/2018    GLU 112 01/01/2006    CA 9.3 07/18/2018    ALBUMIN 3.8 02/18/2017    MG 1.7 06/15/2017    TOTBILI 1.40 02/18/2017   , HgbA1C: No results found for: HGBA1C and Lipid Profile:   Lab Results   Component Value Date    CHOL 117 07/21/2018    TRIG 49 07/21/2018    HDL 52 07/21/2018    LDL 53 07/21/2018    VLDL 10 07/21/2018        Brief Hospital Course: Shane Roach is a 73 y.o. male with a history of coronary artery disease with previous PCI (LAD diagonal bifurcation in 2017), diverticulum of Kommerell, hypertension, carotid atherosclerosis, hyperlipidemia, and paroxysmal atrial fibrillation.  He follows with Dr. Barry Dienes and was evaluated by him on June 11 for routine follow-up with report of fatigue and was concerned his A fib had come back. He denies having any chest discomfort or shortness of air or fatigue prior to his past stent in 2017. Given that he has never had typical angina symptoms he was referred for routine regadenoson thallium MPI to rule out progression of coronary artery disease.  This was done on 07/01/18 and was abnormal revealing evidence of RCA distribution ischemia.  LVEF mildly depressed  at 44% with inferior wall, inferolateral and inferoseptal hypokinesis.  He was therefore referred for cardiac catheterization to further define his coronary anatomy.    LHC showed the previously placed stent in the PDA, as well as the PLV branch are completely occluded. 30 to 40% in-stent restenosis in the previously placed stents in proximal to mid LAD. 50% to 60% in-stent restenosis in the previously placed stent in the diagonal. Normal left ventricular end-diastolic pressure. No significant gradient across the aortic valve on pullback. Patient continued on aspirin 81 mg daily indefinitely and lipitor 80 mg daily.      He is a candidate for ongoing risk factor and medical management. Heart healthy diet, exercise, and weight loss were encouraged.    His post procedure course was uncomplicated and he was discharged home in stable condition     Upon discharge the patient and family were given post procedure instructions/restrictions as well as written instructions ( see Discharge instructions)    ____________________________________________________________________________________    DISCHARGE PLAN:    Follow up: Follow-up with Dr. Wallene Huh on 8/28. Request for Dr. Barry Dienes in 1 month.     Antiplatelet/Anticoagulation: ASA 81 mg EC daily indefinitely and eliquis 5 mg bid DISEASE PREVENTION:    Lipids: LDL 67.  Continued on PTA lipitor 80 mg daily.     HTN: Blood pressure adequately controlled.      Diabetes: Patient is not a diabetic.  Patient encouraged to follow up with primary care provider for further evaluation/ management.     Tobacco: Not a tobacco user.     Obesity: Body mass index is 22.84 kg/m???..  Patient will continue to manage through diet and exercise.  ____________________________________________________________________________________      Condition at Discharge: Stable, right groin D/I, without evidence of hematoma, bleeding, or bruit. Distal pulses intact. Pt was ambulatory in the halls without complaint prior to discharge. BP (!) 145/93  - Pulse 67  - Temp 36.6 ???C (97.8 ???F)  - Ht 1.702 m (5' 7)  - Wt 66.1 kg (145 lb 12.8 oz)  - SpO2 97%  - BMI 22.84 kg/m???     Discharge Diagnoses:  coronary artery disease     Hospital Problems        Active Problems    * (Principal) CAD (coronary artery disease)    Hyperlipemia    History of tobacco abuse    Hypertension    Paroxysmal atrial fibrillation (HCC)    History of ETOH abuse        Significant Diagnostic Studies and Procedures:   LHC 07/21/18:  FINAL IMPRESSIONS:    1. The previously placed stent in the PDA, as well as the PLV branch are completely occluded.  2. 30 to 40% in-stent restenosis in the previously placed stents in proximal to mid LAD.  3. 50% to 60% in-stent restenosis in the previously placed stent in the diagonal.  4. Normal left ventricular end-diastolic pressure.  5. No significant gradient across the aortic valve on pullback.  RECOMMENDATION:  Continue medical therapy and aggressive control of risk factors.    Consults:  None    Patient Disposition: Home       Patient instructions/medications:       Cardiac Diet    Limiting unhealthy fats and cholesterol is the most important step you can take in reducing your risk for cardiovascular disease.  Unhealthy fats include saturated and trans fats.  Monitor your sodium and cholesterol intake.  Restrict your sodium to 2g (grams) or  2000mg  (milligrams) daily, and your cholesterol to 200mg  daily.    If you have questions regarding your diet at home, you may contact a dietitian at [phone].       Report These Signs and Symptoms    Please contact your doctor if you have any of the following symptoms: Chest pain, shortness of breath, lightheadedness, dizziness, near fainting, palpitations, abd pain, back pain, or bleeding.     Risk Reduction Plan    Cardiac Event Personal Risk Factor Reduction Plan  **Take this sheet to your physician to show treatment recommendations**  Dwain L Ruppel  Admission Date: (Not on file) LOS: @LOS @       Blood Pressure Risk  Goal: Keep blood pressure below 130/80  Your Numbers:  BP Readings from Last 1 Encounters:  06/23/18 : (!) 162/88     Plan: High blood pressure is the single most important risk factor for cardiac events because it's the #1 cause of cardiac events.  Take medication as prescribed and monitor your blood pressure.    Abnormal lipids (fats in blood) Risk   Goal: Total Cholesterol: <200  LDL (bad cholesterol): primary prevention <100   LDL (bad cholesterol): secondary prevention < 70  HDL (good cholesterol): >40 for men, >50 for women  Triglycerides: <150  Your Numbers: Cholesterol       Date                     Value               Ref Range           Status                03/26/2017               114                 <200 MG/DL          Final            ----------  LDL       Date                     Value               Ref Range           Status                03/26/2017               67                  <100 MG/DL          Final            ----------  HDL       Date                     Value               Ref Range           Status                03/26/2017               42                  >40 MG/DL           Final            ----------  Triglycerides Date                     Value               Ref Range           Status                03/26/2017               63                  <150 MG/DL          Final            ----------  Plan: Diets high in saturated fat, trans fat and cholesterol can raise blood cholesterol levels increasing your risk of having a cardiac event.  Take medication as prescribed and eat a heart healthy, low sodium diet.    Smoking Risk   Goals: If you smoke, STOP!   Plan: Smoking DOUBLES your risk of having a cardiac event. Quitting can greatly reduce your risk. To register for smoking cessation program call [phone] or visit www.smokefree.gov    Diabetes Risk   Goal: Non-diabetic: Below 5.7%  Goal for diabetic: Less than 7%  Your Numbers: No results found for: HGBA1C  Plan: If you have diabetes, even if treated, you are at an increased risk of having a cardiac event.     Alcohol Use Risk  Goal: Alcohol use can lead to a cardiac event.  Plan: For men, limit intake to no more than 2 drinks per day.  For women, limit intake to no more than one drink per day.    Weight Management Risk   Goal: Healthy: BMI is 18.5 to 24.9  Overweight: BMI is 25 to 29.9  Obese: BMI is 30 or higher  Morbid Obesity: BMI [...]  Your Numbers: Your    Plan: If you have questions about your diet after you go home, you can call a dietitian at [phone]    Physical Activity Risk   Goal: Patients should have approval by a physician prior to beginning an exercise program.  Plan: Try to get at least 30 minutes of moderate physical activity five days a week or 20 minutes of vigorous physical activity three days a week with your doctor's approval.    To the Neurology and the NeuroSurg Discharge order sets set add a new order in the education section titled Risk Reduction Plan, in the comments section add the following (default select this new order for neuro and neuro surg and ensure this information is added to the AVS).     Questions About Your Stay For questions or concerns regarding your hospital stay:    - DURING BUSINESS HOURS (8:00 AM - 4:30 PM):    Call [phone] and asked to be transferred to your discharge attending physician.    - AFTER BUSINESS HOURS (4:30 PM - 8:00 AM, on weekends, or holidays):  Call [phone] and ask the operator to page the on-call doctor for the discharge attending physician.     Discharging attending physician: Greig Castilla [[id number]]      Procedure Specific Activity    *You may drive after 2 days.  *You may shower after discharge.  *NO tub baths, hot tubs, or swimming for 5 days.  *NO lifting greater than 15 pounds for 1 week.  *NO sexual or strenuous activity for 1 week.     Incision Care    *  Call if there is an increase in pain, swelling, or redness.  *DO NOT soak incision in water.  *NO tub baths, hot tubs, or swimming.  *You may shower after discharge.     Request for Cardiology Appointment   Standing Status: Future Standing Exp. Date: 07/21/19     Scheduling Priority: Routine    Schedule OV with (1st choice Provider) Danella Maiers M.D.    Location of Appointment [place] / Tues-Thurs       Current Discharge Medication List       CONTINUE these medications which have NOT CHANGED    Details   aspirin EC 81 mg tablet Take 1 Tab by mouth daily.  Qty: 90 Tab, Refills: 3    PRESCRIPTION TYPE:  No Print      atorvastatin (LIPITOR) 80 mg tablet Take 80 mg by mouth daily. Indications: pt states he takes this in the morning    PRESCRIPTION TYPE:  Historical Med      cetirizine (ZYRTEC) 10 mg tablet Take 10 mg by mouth daily.    PRESCRIPTION TYPE:  Historical Med      ELIQUIS 5 mg tablet TAKE 1 TABLET BY MOUTH TWICE DAILY  Qty: 60 tablet, Refills: 11    PRESCRIPTION TYPE:  Normal      lisinopril (PRINIVIL; ZESTRIL) 10 mg tablet Take one tablet by mouth daily.  Qty: 90 tablet, Refills: 3    PRESCRIPTION TYPE:  Normal      metoprolol tartrate (LOPRESSOR) 25 mg tablet Take 1.5 tablets by mouth twice daily. Qty: 270 tablet, Refills: 3    PRESCRIPTION TYPE:  Normal      vitamins, multiple Cap Take 1 Cap by mouth Daily.    PRESCRIPTION TYPE:  Historical Med           Scheduled appointments:    Sep 09, 2018  9:30 AM CDT  Return Patient with Jen Mow, MD  [facility] ([facility]) [address]  [place] [address]  [phone]          Pending items needing follow up: as above    Signed:  Jearld Lesch, APRN-NP  07/22/2018      cc:  Primary Care Physician:  Steva Ready   Verified  Referring physicians:  Greig Castilla, MD   Additional provider(s):

## 2018-07-26 NOTE — H&P (View-Only)
Patient presents for cardiac procedure. Please see previously completed H&P below.  ???  Newton Pigg, APRN-C (pager [phone])  ???  ______________________________________________     ???      Arine Foley, Erskine Speed, APRN-NP   Nurse Practitioner   Cardiology-Interventional   Progress Notes    Signed    ???    ???       ???   ???   ???  ???  Cardiology Pre Procedure Progress Note   ???  Procedure Date:???07/21/2018???  ???  Planned Procedure(s):???Left heart catheterization possible PCI  ???  Indication:?????????Abnormal stress test  ___________________________________________________________________________________________________________________________________  ???  Chief Complaint:???Abnormal stress test  ???  Assessment and Plan:???  Abnormal thallium MPI  Coronary artery disease   Hypertension  Paroxysmal atrial fibrillation  Hyperlipidemia  ???  Patient presented to Dr. Barry Dienes for routine evaluation and thallium MPI was ordered given his history of coronary artery disease. ???This was done on 07/01/18 and was abnormal revealing evidence of RCA distribution ischemia. ???He was noted to have mildly depressed LV systolic function with an EF of 44% with inferior wall, inferolateral and inferoseptal hypokinesis. ???He was therefore referred for cardiac catheterization to further define his coronary anatomy. ???The procedure as well as the risks and benefits were explained to the patient at length and he is willing to proceed as directed.   ???  Jearld Lesch, APRN-NP  Interventional Cardiology   Pager???[phone]  ???  ____________________________________________________________________________________________________________________________________  ???  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 ???He follows with Dr. Barry Dienes and was evaluated by him on June 11 for routine follow-up with report of fatigue and was concerned his A fib had come back.???He denies having any chest discomfort or shortness of air or fatigue prior to his past stent in 2017. Given that he has never had typical angina symptoms he was referred for routine regadenoson thallium MPI to rule out progression of coronary artery disease. ???This was done on 07/01/18???and was abnormal revealing evidence of RCA distribution ischemia. ???LVEF mildly depressed at 44% with inferior wall, inferolateral and inferoseptal hypokinesis. ???He was therefore referred for cardiac catheterization to further define his coronary anatomy.  ???  Patient endorses progressive fatigue. 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 He finds himself becoming worn out quickly and has developed heat intolerance. He denies excessive diaphoresis but finds it more challenging to breath in the heat. He quit smoking ~ 7 yrs ago and blames breathing on untreated COPD. He???denies chest pain, chest heaviness,orthopnea, paroxysmal nocturnal dyspnea, syncope, near syncope, fever, chills, or N/V/D.???  ???  ???  ??? ??? ???   Patient Active Problem List   ??? Diagnosis Date Noted   ??? 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 History of ETOH abuse 03/25/2017   ??? AKI (acute kidney injury) (HCC) 03/25/2017   ??? Encounter for monitoring sotalol therapy 03/25/2017   ??? Paroxysmal atrial fibrillation (HCC) 02/22/2017   ??? Screening for cardiovascular condition 07/23/2015   ??? Hyperlipemia 04/23/2009   ??? CAD (coronary artery disease) 04/23/2009   ??? Prostate CA (HCC) 04/23/2009   ??? GI bleed 04/23/2009   ??? History of tobacco abuse 04/23/2009   ??? LBBB (left bundle branch block) 04/23/2009   ??? 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  Hypertension 04/23/2009   ??? Carotid atherosclerosis 11/22/2008   ??? Dizziness 11/22/2008 ???  ??? ??? ???   Medical History:   Diagnosis Date   ??? Coronary artery disease ???   ??? Diverticulum of Kommerell ???   ??? History of ETOH abuse 03/25/2017   ??? Hyperlipidemia ???   ??? Hypertension ???   ??? LBBB (left bundle branch block) 04/23/2009   ??? Prostate cancer (HCC) ???   ??? Tobacco abuse 04/23/2009   ???  ??? ??? ??? ???   Surgical History:   Procedure Laterality Date   ??? Left Heart Catheterization With Ventriculogram Left 07/17/2015   ??? Performed by Marcell Barlow, MD, [facility] CATH LAB   ??? Coronary Angiography N/A 07/17/2015   ??? Performed by Marcell Barlow, MD, [facility] CATH LAB   ??? Possible Percutaneous Coronary Intervention N/A 07/17/2015   ??? Performed by Marcell Barlow, MD, [facility] at [facility] CATH LAB   ??? INTRACARDIAC CATHETER ABLATION WITH COMPREHENSIVE ELECTROPHYSIOLOGIC EVALUATION - ATRIAL FIBRILLATION Left 06/14/2017   ??? Performed by Jen Mow, MD at [facility] EP LAB   ??? TRANSESOPHAGEAL ECHOCARDIOGRAM DURING INTERVENTION N/A 06/14/2017   ??? Performed by Cath, Physician at [facility] EP LAB   ???  Prescriptions Prior to Admission   ??? ??? ??? ??? ??? ???   Medications Prior to Admission   Medication Sig Dispense Refill Last Dose   ??? aspirin EC 81 mg tablet Take 1 Tab by mouth daily. 90 Tab 3 07/21/2018   ??? atorvastatin (LIPITOR) 80 mg tablet Take 80 mg by mouth daily. Indications: pt states he takes this in the morning ??? ??? 07/20/2018   ??? cetirizine (ZYRTEC) 10 mg tablet Take 10 mg by mouth daily. ??? ??? 07/20/2018   ??? ELIQUIS 5 mg tablet TAKE 1 TABLET BY MOUTH TWICE DAILY 60 tablet 11 07/19/2018   ??? lisinopril (PRINIVIL; ZESTRIL) 10 mg tablet Take one tablet by mouth daily. 90 tablet 3 07/21/2018   ??? metoprolol tartrate (LOPRESSOR) 25 mg tablet Take 1.5 tablets by mouth twice daily. 270 tablet 3 07/21/2018   ??? vitamins, multiple Cap Take 1 Cap by mouth Daily. ??? ??? 07/20/2018      ???  No Known Allergies  ???  ???  No current facility-administered medications on file prior to encounter.    ???  ??? ??? ??? ??? ???   Current Outpatient Medications on File Prior to Encounter Medication Sig Dispense Refill   ??? aspirin EC 81 mg tablet Take 1 Tab by mouth daily. 90 Tab 3   ??? atorvastatin (LIPITOR) 80 mg tablet Take 80 mg by mouth daily. Indications: pt states he takes this in the morning ??? ???   ??? cetirizine (ZYRTEC) 10 mg tablet Take 10 mg by mouth daily. ??? ???   ??? ELIQUIS 5 mg tablet TAKE 1 TABLET BY MOUTH TWICE DAILY 60 tablet 11   ??? lisinopril (PRINIVIL; ZESTRIL) 10 mg tablet Take one tablet by mouth daily. 90 tablet 3   ??? metoprolol tartrate (LOPRESSOR) 25 mg tablet Take 1.5 tablets by mouth twice daily. 270 tablet 3   ??? vitamins, multiple Cap Take 1 Cap by mouth Daily. ??? ???   ???  ???  Social History:???  Social History   ???  ??? ??? ???   Tobacco Use   ??? Smoking status: Former Smoker   ??? Smokeless tobacco: Never Used   Substance Use Topics   ??? Alcohol use: No   ???  No family history on file.???  ???  Review of Systems  General:???fatigue, heat intolerance???  Eyes:??????negative/normal.  Ears/Nose/Throat:??????negative/normal.  Cardiovascular:??????negative/normal.  Respiratory:???SOA in the heat???  Gastrointestinal:??????negative/normal.  Genitourinary:??????negative/normal.  Musculoskeletal:??????negative/normal.  Skin: negative/normal.   Neurologic:??????negative/normal.  Psychiatric:??????negative/normal.  Endocrine:??????negative/normal.  Heme/Lymphatic: negative/normal.  Allergic/Immunologic:??????negative/normal.  ???  ???  Physical Exam:  Vital Signs: Last Filed In 24 Hours Vital Signs: 24 Hour Range   BP: 161/96 (07/09 1100)  Temp: 36.6 ???C (97.9 ???F) (07/09 1028)  Pulse: 54 (07/09 1100)  Respirations: 17 PER MINUTE (07/09 1100)  SpO2: 98 % (07/09 1100)  SpO2 Pulse: 54 (07/09 1100)  Height: 170.2 cm (5' 7) (07/09 1028) BP: (154-161)/(83-96)   Temp: ???[36.6 ???C (97.9 ???F)]   Pulse: ???[54]   Respirations: ???[13 PER MINUTE-17 PER MINUTE]   SpO2: ???[98 %-99 %]    ??? ???   ???  ???  Gen:???appears stated age, in no acute distress  Head: normocephalic, atraumatic  Eyes:???sclera non-icteric, EOMs intact   Mouth:???mucous membranes are moderately moist Lungs:???CTA bilaterally without rales or rhonchi  Heart:???S1, S2  Abdomen:???soft, nontender, bowel sounds present  Extremities:???no C/C/E, pedal pulses are intact  Skin:???warm and dry  Neurological:???A&Ox3, no focal deficits noted  Psychiatric:???calm, pleasant, and cooperative  ???  ???  Lab/Radiology/Other Diagnostic Tests:  Labs:??????  Hematology:??????  ??? ??? ??? ???   Lab Results   Component Value Date   ??? HGB 15.9 07/18/2018   ??? HCT 48.7 07/18/2018   ??? PLTCT 149 07/18/2018   ??? WBC 5.0 07/18/2018   ??? NEUT 67 06/11/2005   ??? ANC 4.90 06/11/2005   ??? ALC 1.73 06/11/2005   ??? MONA 8 06/11/2005   ??? AMC 0.55 06/11/2005   ??? ABC 0.03 06/11/2005   ??? MCV 92.4 07/18/2018   ??? MCHC 32.6 07/18/2018   ??? MPV 8.9 06/15/2017   ??? RDW 13.3 07/18/2018   , General Chemistry:??????  ??? ??? ??? ???   Lab Results   Component Value Date   ??? NA 139 07/18/2018   ??? K 4.4 07/18/2018   ??? CL 107 07/18/2018   ??? GAP 10 07/18/2018   ??? BUN 17.0 07/18/2018   ??? CR 0.97 07/18/2018   ??? GLU 97 07/18/2018   ??? GLU 112 01/01/2006   ??? CA 9.3 07/18/2018   ??? ALBUMIN 3.8 02/18/2017   ??? MG 1.7 06/15/2017   ??? TOTBILI 1.40 02/18/2017   , HgbA1C:???No results found for: HGBA1C???and Lipid Profile:   ??? ??? ??? ???   Lab Results   Component Value Date   ??? CHOL 114 03/26/2017   ??? TRIG 63 03/26/2017   ??? HDL 42 03/26/2017   ??? LDL 67 03/26/2017   ??? VLDL 13 03/26/2017   ???  ???  ???   ???  ???  ???  ???      Revision History

## 2018-08-15 ENCOUNTER — Encounter: Admit: 2018-08-15 | Discharge: 2018-08-15

## 2018-08-15 MED ORDER — LISINOPRIL 10 MG PO TAB
ORAL_TABLET | Freq: Every day | 3 refills | Status: AC
Start: 2018-08-15 — End: ?

## 2018-09-08 ENCOUNTER — Encounter: Admit: 2018-09-08 | Discharge: 2018-09-08

## 2018-09-14 ENCOUNTER — Encounter: Admit: 2018-09-14 | Discharge: 2018-09-14

## 2019-01-19 ENCOUNTER — Encounter: Admit: 2019-01-19 | Discharge: 2019-01-19

## 2019-02-02 ENCOUNTER — Encounter: Admit: 2019-02-02 | Discharge: 2019-02-02

## 2019-02-02 NOTE — Progress Notes
Called insurance to see if I could provide clinicals for peer to peer since [facility] is out of office and the deadline is 1/22. I was not able to complete peer to peer, has to be MD or APP.  I was informed that a carotid duplex and US abdomen does not need a peer to peer and would be approved.  CTA of chest is approved.  Will review with SDO after he returns.      PEER TO PEER IS NEEDED      Peer to Peer: for CPT CODES 19147 AND 82956   Name: Shane Roach, Shane Roach   MRN: [id number]     The above patient's carrier has requested a peer to peer be completed for your patient's exam scheduled on _ 01.29.2021  Please review the information below:     Ref/Cert#: [id number]   [employer]or Phone: [phone] OPTION 4 ([place])   Peer to Peer Deadline Date: 01.22.2021   Peer to Peer Reason:   PER ASHLEY AT [employer] DID NOT MEDICAL GUIDELINES PEER   TO PEER CAN BE DONE !   Thank you   JUDY REED

## 2019-02-10 ENCOUNTER — Encounter: Admit: 2019-02-10 | Discharge: 2019-02-10

## 2019-02-10 ENCOUNTER — Ambulatory Visit: Admit: 2019-02-10 | Discharge: 2019-02-10 | Payer: MEDICARE

## 2019-02-10 DIAGNOSIS — Q2549 Other congenital malformations of aorta: Secondary | ICD-10-CM

## 2019-02-10 DIAGNOSIS — Q2547 Right aortic arch: Secondary | ICD-10-CM

## 2019-02-10 LAB — POC CREATININE, RAD: Lab: 1 mg/dL (ref 0.4–1.24)

## 2019-02-10 MED ORDER — IOHEXOL 350 MG IODINE/ML IV SOLN
80 mL | Freq: Once | INTRAVENOUS | 0 refills | Status: CP
Start: 2019-02-10 — End: ?
  Administered 2019-02-10: 18:00:00 80 mL via INTRAVENOUS

## 2019-02-10 MED ORDER — SODIUM CHLORIDE 0.9 % IJ SOLN
50 mL | Freq: Once | INTRAVENOUS | 0 refills | Status: CP
Start: 2019-02-10 — End: ?
  Administered 2019-02-10: 18:00:00 50 mL via INTRAVENOUS

## 2019-02-14 ENCOUNTER — Encounter: Admit: 2019-02-14 | Discharge: 2019-02-14

## 2019-02-14 NOTE — Telephone Encounter
-----   Message from Michiel Cowboy, MD sent at 02/14/2019 11:43 AM CST -----  [facility] Nursing, can you please let him know that this looks stable, OK for him?    Cc:  Dr. Jaclyn Shaggy

## 2019-02-14 NOTE — Telephone Encounter
02/14/2019 1:42 PM   LM notifying patient of results per Dr. Ricard Dillon.

## 2019-02-27 ENCOUNTER — Ambulatory Visit: Admit: 2019-02-27 | Discharge: 2019-02-27 | Payer: MEDICARE

## 2019-02-27 ENCOUNTER — Encounter: Admit: 2019-02-27 | Discharge: 2019-02-27

## 2019-02-27 DIAGNOSIS — I2510 Atherosclerotic heart disease of native coronary artery without angina pectoris: Secondary | ICD-10-CM

## 2019-02-27 DIAGNOSIS — I447 Left bundle-branch block, unspecified: Secondary | ICD-10-CM

## 2019-02-27 DIAGNOSIS — I480 Paroxysmal atrial fibrillation: Secondary | ICD-10-CM

## 2019-02-27 DIAGNOSIS — Q2549 Other congenital malformations of aorta: Secondary | ICD-10-CM

## 2019-02-27 DIAGNOSIS — I6529 Occlusion and stenosis of unspecified carotid artery: Secondary | ICD-10-CM

## 2019-02-27 DIAGNOSIS — I10 Essential (primary) hypertension: Secondary | ICD-10-CM

## 2019-02-27 DIAGNOSIS — E785 Hyperlipidemia, unspecified: Secondary | ICD-10-CM

## 2019-02-27 DIAGNOSIS — C61 Malignant neoplasm of prostate: Secondary | ICD-10-CM

## 2019-02-27 DIAGNOSIS — F1011 Alcohol abuse, in remission: Secondary | ICD-10-CM

## 2019-02-27 DIAGNOSIS — Z720 Tobacco use: Secondary | ICD-10-CM

## 2019-02-27 DIAGNOSIS — E782 Mixed hyperlipidemia: Secondary | ICD-10-CM

## 2019-02-27 NOTE — Progress Notes
Date of Service: 02/27/2019    Shane Roach is a 74 y.o. male.       HPI     Shane Roach was in the [facility] clinic today for follow-up regarding coronary disease and paroxysmal atrial fibrillation.  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  He has tried to get on a list for a Covid vaccine but has not been successful yet.  The kids mask on the bus and have their temperature checked as they enter but I did urge Shane Roach to get the vaccine as soon as he possibly can.    He seems to be fine from a cardiovascular standpoint.  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  He just had a CT about a month ago and the configuration looks stable.    He denies any chest discomfort, but he has never had typical angina symptoms related to his coronary disease.  We reviewed the findings from last years stress test and coronary arteriogram.  He has occlusion of a couple of small branches of the right coronary artery that were not considered amenable to stenting.  This was the distribution of abnormality on his stress test and something will need to keep in mind going forward as we do surveillance stress testing.    He denies any palpitations or lightheadedness.  He has had no significant bleeding complications related to oral anticoagulation.  He denies any TIA or stroke symptoms.         Vitals:    02/27/19 1005   BP: (!) 140/79   Pulse: 59   SpO2: 96%   Weight: 68 kg (150 lb)   Height: 1.727 m (5' 8)   PainSc: Zero     Body mass index is 22.81 kg/m?[name]     Past Medical History  Patient Active Problem List    Diagnosis Date Noted   ? 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 History of ETOH abuse 03/25/2017   ? AKI (acute kidney injury) (HCC) 03/25/2017   ? Encounter for monitoring sotalol therapy 03/25/2017   ? Paroxysmal atrial fibrillation (HCC) 02/22/2017   ? Screening for cardiovascular condition 07/23/2015     07/10/15:  AAA Duplex at [facility] normal.  No AAA.     ? Hyperlipemia 04/23/2009     09/01/01 - New diagnosis.  Therapy not yet initiated.           ? CAD (coronary artery disease) 04/23/2009     09/01/01 - Left-sided exertional  c/p radiating to throat/L arm.  [facility] Eval>CTchest/.PFT normal. LBBB EKG>>Dr. Jonelle Sports consult  09/06/01 - Cath: EF 50%,  bifurcation LAD/Dx disease, 90% D RCA, PDA,   09/07/01 - 4 Lesion PCI:  3.0 Cypher LAD,  2.5 Cypher Dx, 3.5 Cypher dRCA,  2.5 Express PDA Dr. Ludwig Lean  3/05 - Stress echo: EF 60%, LVH, LBBB, non-ischemic.  6/07 - LAD-LADD restenosis--PCI/stent -Ludwig Lean    07/17/15: Cardiac cath: LAD with 20% ISRS, Diagonal with 50% ISRS ( negative FFR from 0.92 --> 0.89), Lcx free of dz, RCA dominant with 20% mid, 90% ISRS to PDA. Successful DES placed.  30-40% RPW.  LVEDP 11 mmHg  ~ Dual antiplatelet therapy with Aspirin lifelong and Plavix 75 mg daily at least 6 months to 1 year w/o interruption to prevent stent thrombosis and possible myocardial infarction.     07/21/18 Cath: The previously placed stent in the  PDA, as well as the PLV branch are completely occluded. 30 to 40% in-stent restenosis in the previously placed stents in proximal to mid LAD. 50% to 60% in-stent restenosis in the previously placed stent in the diagonal. Normal left ventricular end-diastolic pressure. No significant gradient across the aortic valve on pullback. Continue medical therapy and aggressive control of risk factors.     ? Prostate CA (HCC) 04/23/2009     Seed implantation     ? GI bleed 04/23/2009     Hx Gastric ulcer in 1997. No further issues. Tolerated DAPT in 2007 w/o issues.   ~ Dr. Lance Sell office called with no records of GI procedures.             ? History of tobacco abuse 04/23/2009     8/03 - 1 ppd x 45 years     ? LBBB (left bundle branch block) 04/23/2009   ? 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  07/2014 - CTA showed stable disease     ? Hypertension 04/23/2009   ? Carotid atherosclerosis 11/22/2008     Carotid duplex 11/06/08 [facility]):  50-69% left external carotid.  <30% stenosis in the internal carotid arteries bilaterally  MRI/MRA head 11/06/08 [facility]):  Turbulent flow in the right internal carotid, but no significant stenosis.  No other vascular abnormalities noted.  Posterior circulation normal.     ? Dizziness 11/22/2008     11/05/08:  Acute onset dizziness requiring hospitalization in [place].  MRI/MRA head unremarkable.  CT-head normal.  Discharged on meclizine with diagnosis labyrinthitis.           Review of Systems   Constitution: Negative.   HENT: Negative.    Eyes: Negative.    Cardiovascular: Negative.    Respiratory: Negative.    Endocrine: Negative.    Hematologic/Lymphatic: Negative.    Skin: Negative.    Musculoskeletal: Negative.    Gastrointestinal: Negative.    Genitourinary: Negative.    Neurological: Negative.    Psychiatric/Behavioral: Negative.    Allergic/Immunologic: Negative.        Physical Exam    Physical Exam   General Appearance: no distress   Skin: warm, no ulcers or xanthomas   Digits and Nails: no cyanosis or clubbing   Eyes: conjunctivae and lids normal, pupils are equal and round   Teeth/Gums/Palate: dentition unremarkable, no lesions   Lips & Oral Mucosa: no pallor or cyanosis   Neck Veins: normal JVP , neck veins are not distended   Thyroid: no nodules, masses, tenderness or enlargement   Chest Inspection: chest is normal in appearance   Respiratory Effort: breathing comfortably, no respiratory distress   Auscultation/Percussion: lungs clear to auscultation, no rales or rhonchi, no wheezing   PMI: PMI not enlarged or displaced   Cardiac Rhythm: regular rhythm and normal rate   Cardiac Auscultation: S1, S2 normal, no rub, no gallop   Murmurs: no murmur   Peripheral Circulation: normal peripheral circulation   Carotid Arteries: normal carotid upstroke bilaterally, no bruits   Radial Arteries: normal symmetric radial pulses   Abdominal Aorta: no abdominal aortic bruit  Pedal Pulses: normal symmetric pedal pulses   Lower Extremity Edema: no lower extremity edema   Abdominal Exam: soft, non-tender, no masses, bowel sounds normal   Liver & Spleen: no organomegaly   Gait & Station: walks without assistance   Muscle Strength: normal muscle tone   Orientation: oriented to time, place and person   Affect & Mood: appropriate and sustained affect   Language and Memory: patient responsive and seems to comprehend information   Neurologic Exam: neurological assessment grossly intact   Other: moves all extremities      Problems Addressed Today  Encounter Diagnoses   Name Primary?   ? Carotid atherosclerosis, unspecified laterality    ? Mixed hyperlipidemia    ? Coronary artery disease involving native coronary artery, angina presence unspecified, unspecified whether native or transplanted heart    ? Diverticulum of Kommerell    ? Essential hypertension    ? Paroxysmal atrial fibrillation (HCC)        Assessment and Plan       Carotid atherosclerosis  No TIA or stroke symptoms.    Hyperlipemia  Lab Results   Component Value Date    CHOL 117 07/21/2018    TRIG 49 07/21/2018    HDL 52 07/21/2018    LDL 53 07/21/2018    VLDL 10 07/21/2018    NONHDLCHOL 65 07/21/2018    CHOLHDLC 2 09/26/2014    CHOLHDLC 2 09/26/2014      LDL treated to goal.  He sees Dr. Andreas Newport once yearly.    CAD (coronary artery disease)  Given his lack of angina symptoms and occupation as a school bus driver I have recommended regular surveillance stress testing.  He has left bundle branch block and will require regadenoson thallium imaging, a test that I will schedule for next time I see him about a year from now.    Diverticulum of Kommerell  His most recent CTA shows no progression of disease.    Hypertension  He checks his blood pressure at home and says that it is consistently lower than 130/80.    Paroxysmal atrial fibrillation (HCC)  He is tolerating oral anticoagulation and is asymptomatic.      Current Medications (including today's revisions)  ? aspirin EC 81 mg tablet Take 1 Tab by mouth daily.   ? atorvastatin (LIPITOR) 80 mg tablet Take 80 mg by mouth daily. Indications: pt states he takes this in the morning   ? cetirizine (ZYRTEC) 10 mg tablet Take 10 mg by mouth daily.   ? ELIQUIS 5 mg tablet TAKE 1 TABLET BY MOUTH TWICE DAILY   ? latanoprost (XALATAN) 0.005 % ophthalmic solution INSTILL 1 DROP INTO BOTH EYES AT BEDTIME AS DIRECTED   ? lisinopriL (ZESTRIL) 10 mg tablet TAKE 1 TABLET BY MOUTH EVERY DAY   ? metoprolol tartrate (LOPRESSOR) 25 mg tablet Take 1.5 tablets by mouth twice daily.   ? vitamins, multiple Cap Take 1 Cap by mouth Daily.     Total time spent on today's office visit was 30 minutes.  This includes face-to-face in person visit with patient as well as nonface-to-face time including review of the EMR, outside records, labs, radiologic studies, echocardiogram & other cardiovascular studies, formation of treatment plan, after visit summary, future disposition, and lastly on documentation.

## 2019-02-27 NOTE — Assessment & Plan Note
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  He has left bundle branch block and will require regadenoson thallium imaging, a test that I will schedule for next time I see him about a year from now.

## 2019-02-27 NOTE — Assessment & Plan Note
He is tolerating oral anticoagulation and is asymptomatic.

## 2019-02-27 NOTE — Assessment & Plan Note
No TIA or stroke symptoms.

## 2019-02-27 NOTE — Assessment & Plan Note
His most recent CTA shows no progression of disease.

## 2019-02-27 NOTE — Assessment & Plan Note
He checks his blood pressure at home and says that it is consistently lower than 130/80.

## 2019-02-27 NOTE — Assessment & Plan Note
Lab Results   Component Value Date    CHOL 117 07/21/2018    TRIG 49 07/21/2018    HDL 52 07/21/2018    LDL 53 07/21/2018    VLDL 10 07/21/2018    NONHDLCHOL 65 07/21/2018    CHOLHDLC 2 09/26/2014    CHOLHDLC 2 09/26/2014      LDL treated to goal.  He sees Dr. Jaclyn Shaggy once yearly.

## 2019-05-19 ENCOUNTER — Encounter: Admit: 2019-05-19 | Discharge: 2019-05-19

## 2019-05-19 MED ORDER — METOPROLOL TARTRATE 25 MG PO TAB
ORAL_TABLET | Freq: Two times a day (BID) | ORAL | 3 refills | 90.00000 days | Status: AC
Start: 2019-05-19 — End: ?

## 2019-06-23 ENCOUNTER — Encounter: Admit: 2019-06-23 | Discharge: 2019-06-23

## 2019-06-23 MED ORDER — APIXABAN 5 MG PO TAB
5 mg | ORAL_TABLET | Freq: Two times a day (BID) | ORAL | 3 refills | Status: AC
Start: 2019-06-23 — End: ?

## 2019-07-06 IMAGING — CR CHEST
2 series · 2 of 2 positions shown · non-contrast
Comparison: none

[chest pa x-wise]
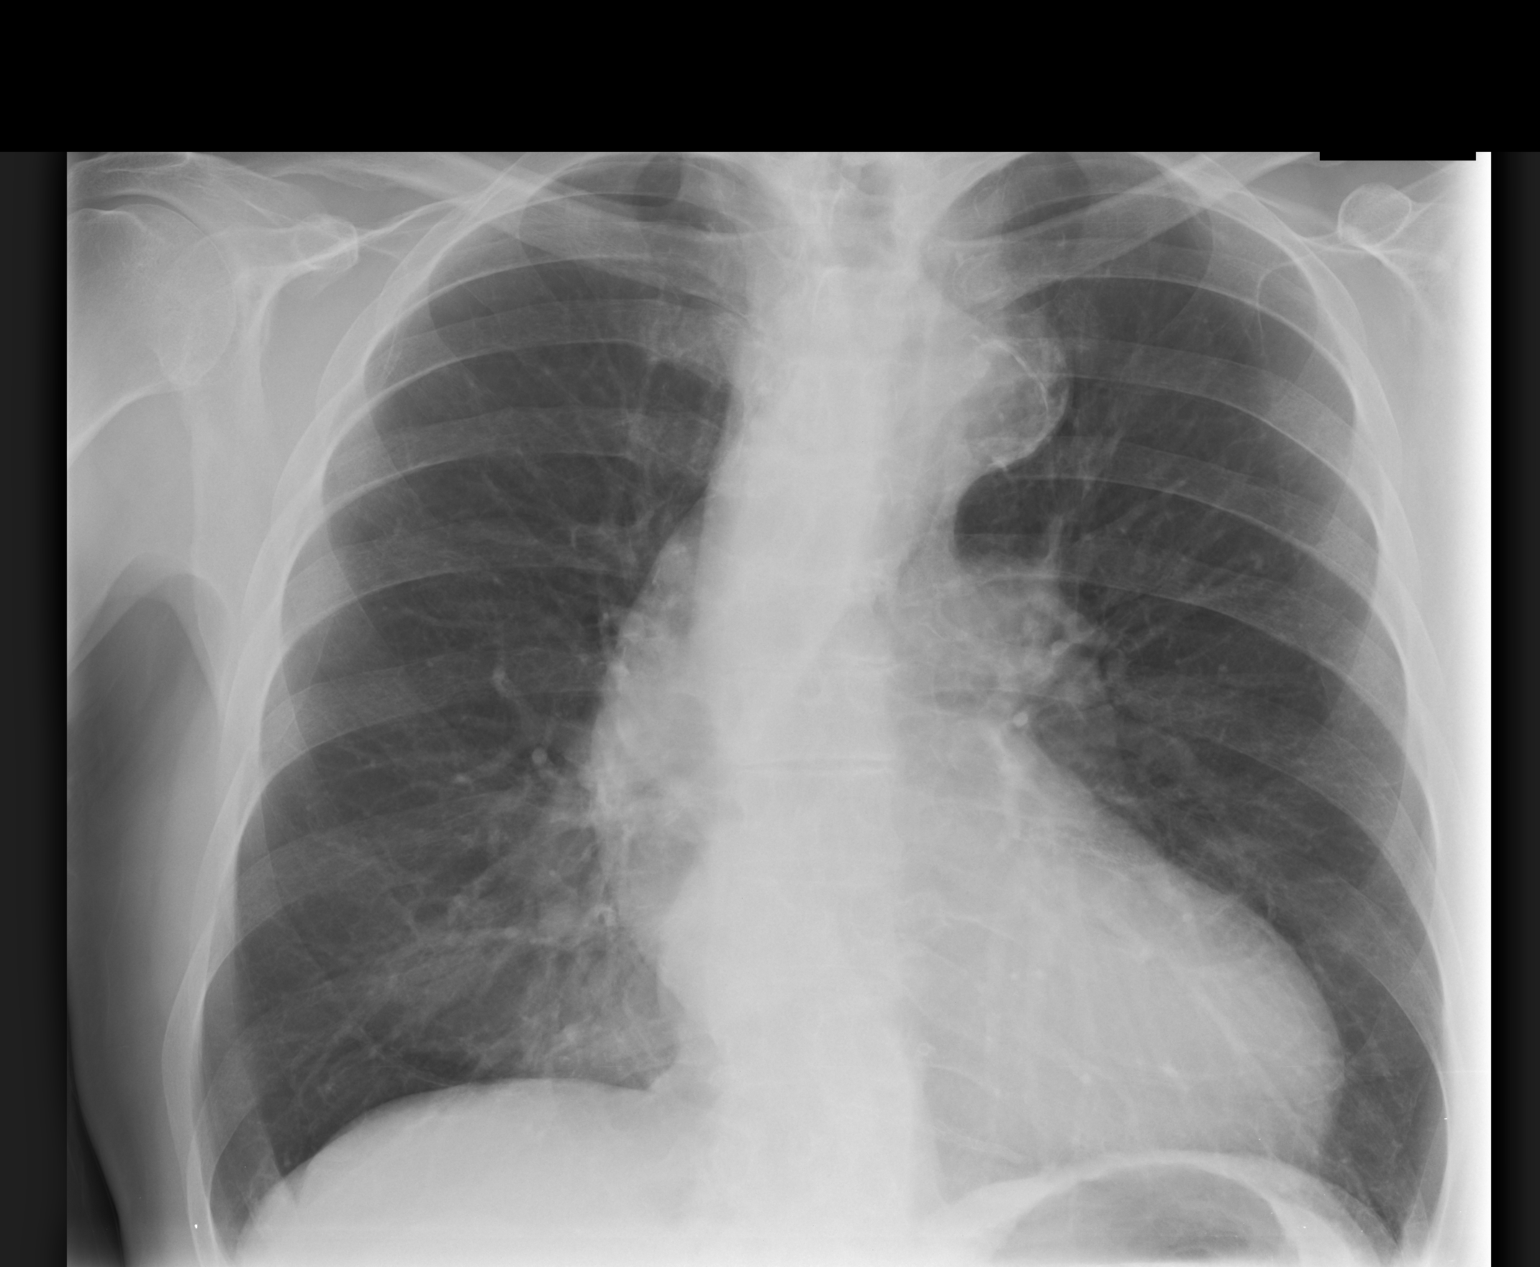

[chest lat]
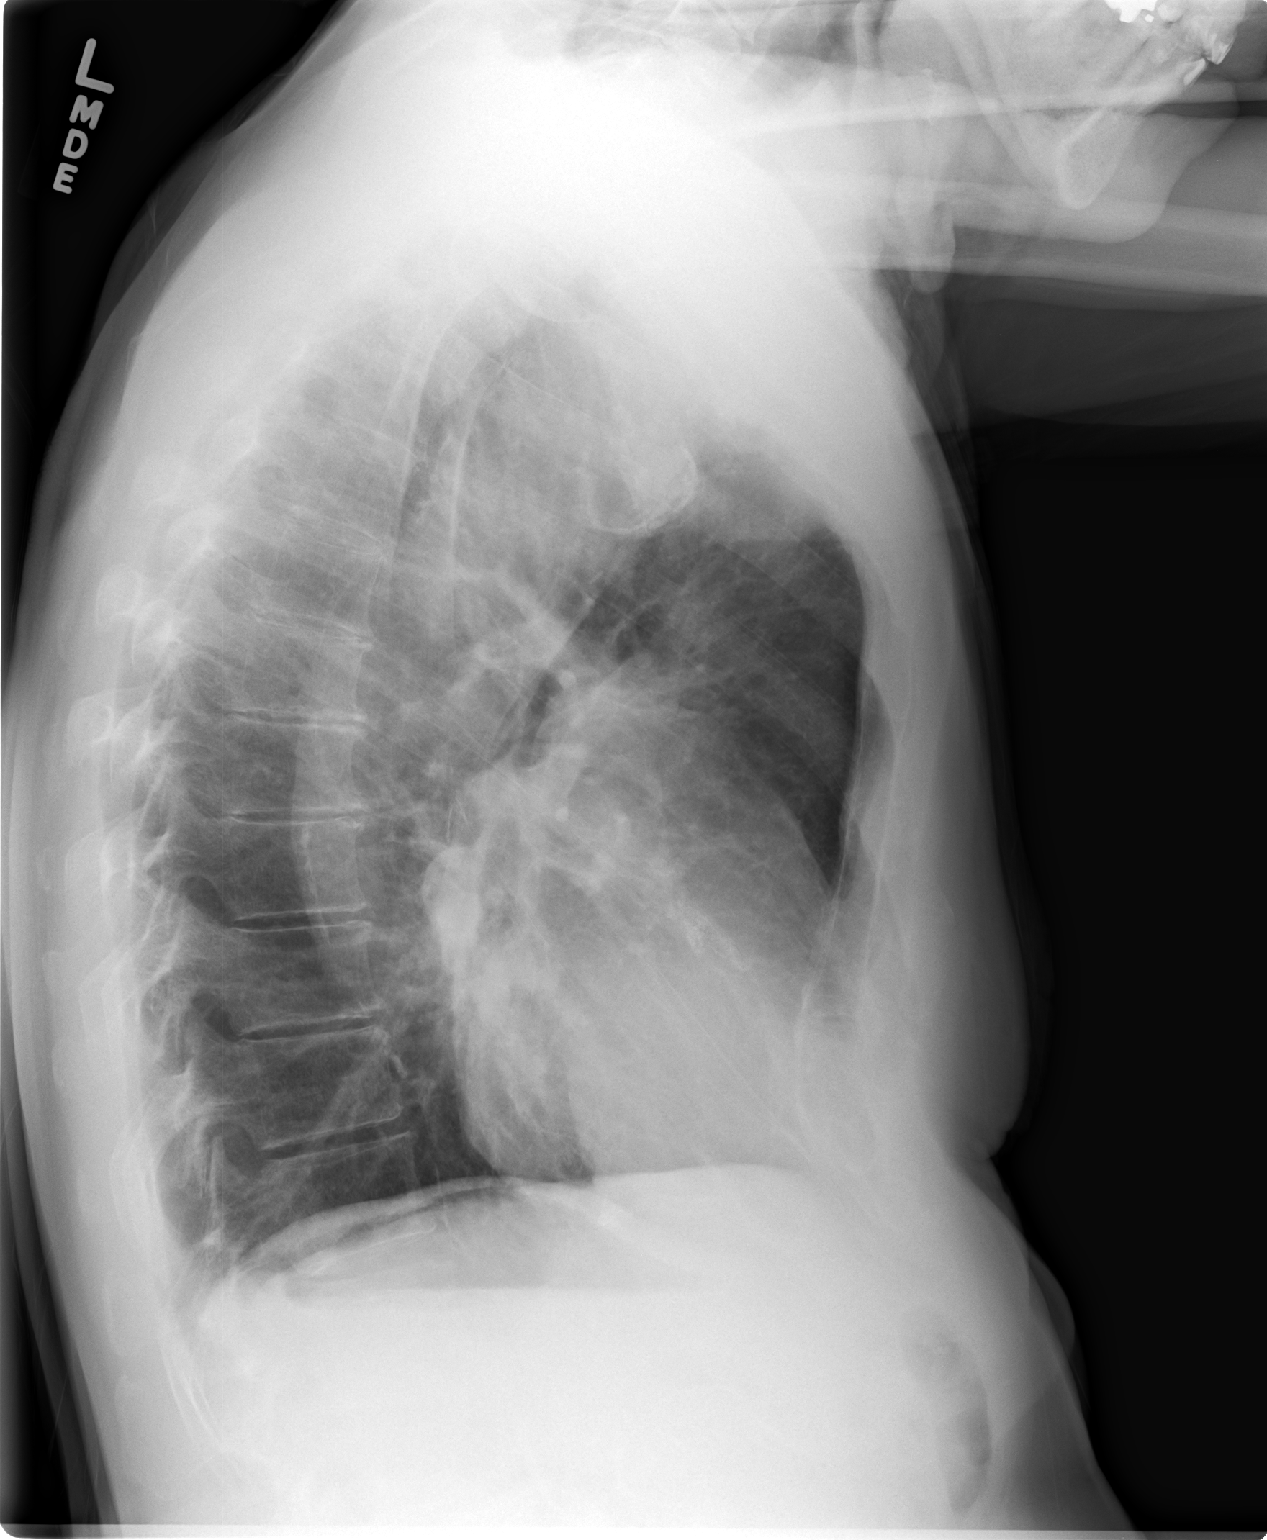

[2 of 2 positions shown; findings below may reference images not displayed]

DIAGNOSTIC STUDIES

EXAM
Chest x-ray

INDICATION
cough, rhonchi
pt c/o cough and soa. ME

TECHNIQUE
PA and lateral views of the chest were obtained peer

COMPARISONS
February 15, 2017

FINDINGS
Mild cardiomegaly is noted with marked tortuosity of the thoracic aorta. Mild COPD is seen. No
acute infiltrates are evident. Osseous structures are stable.

IMPRESSION
Mild COPD. No acute infiltrates are seen.
Marked tortuosity of the aorta. CT angiogram could be obtained to exclude aneurysmal enlargement.]

Tech Notes:

pt c/o cough and soa. ME

## 2019-09-21 ENCOUNTER — Encounter: Admit: 2019-09-21 | Discharge: 2019-09-21

## 2019-09-21 MED ORDER — LISINOPRIL 10 MG PO TAB
ORAL_TABLET | Freq: Every day | 3 refills | Status: AC
Start: 2019-09-21 — End: ?

## 2020-02-21 NOTE — Telephone Encounter
Left a detailed message for patient with stress test instructions. NPO the morning of the exam and no caffeine for the 24 hours prior.

## 2020-02-23 ENCOUNTER — Encounter

## 2020-02-23 DIAGNOSIS — I2510 Atherosclerotic heart disease of native coronary artery without angina pectoris: Secondary | ICD-10-CM

## 2020-02-23 MED ORDER — SODIUM CHLORIDE 0.9 % IV SOLP
250 mL | INTRAVENOUS | 0 refills | Status: AC | PRN
Start: 2020-02-23 — End: ?

## 2020-02-23 MED ORDER — REGADENOSON 0.4 MG/5 ML IV SYRG
.4 mg | Freq: Once | INTRAVENOUS | 0 refills | Status: CP
Start: 2020-02-23 — End: ?

## 2020-02-23 MED ORDER — EUCALYPTUS-MENTHOL MM LOZG
1 | Freq: Once | ORAL | 0 refills | Status: AC | PRN
Start: 2020-02-23 — End: ?

## 2020-02-23 MED ORDER — NITROGLYCERIN 0.4 MG SL SUBL
.4 mg | SUBLINGUAL | 0 refills | Status: AC | PRN
Start: 2020-02-23 — End: ?

## 2020-02-23 MED ORDER — AMINOPHYLLINE 500 MG/20 ML IV SOLN
50 mg | INTRAVENOUS | 0 refills | Status: AC | PRN
Start: 2020-02-23 — End: ?

## 2020-02-23 MED ORDER — RP DX TL-201 THALLOUS CHL MCI
.5 | Freq: Once | INTRAVENOUS | 0 refills | Status: CP
Start: 2020-02-23 — End: ?

## 2020-02-23 MED ORDER — ALBUTEROL SULFATE 90 MCG/ACTUATION IN HFAA
2 | RESPIRATORY_TRACT | 0 refills | Status: AC | PRN
Start: 2020-02-23 — End: ?

## 2020-02-23 MED ORDER — RP DX TL-201 THALLOUS CHL MCI
3 | Freq: Once | INTRAVENOUS | 0 refills | Status: CP
Start: 2020-02-23 — End: ?

## 2020-02-28 NOTE — Telephone Encounter
-----   Message from Michiel Cowboy, MD sent at 02/28/2020  1:19 PM CST -----  At[facility] Nursing, can you please let Zell know that this looks similar to the prior study.  He had a cath after the 2020 study and did not require further intervention.  I'm scheduled to see him in April I think.  Thanks.    Cc:  Dr. Jaclyn Shaggy

## 2020-02-28 NOTE — Telephone Encounter
Left voice mail message with stress test results and call back number for further questions or concerns.

## 2020-04-24 ENCOUNTER — Encounter: Admit: 2020-04-24 | Discharge: 2020-04-24

## 2020-04-25 ENCOUNTER — Encounter: Admit: 2020-04-25 | Discharge: 2020-04-25

## 2020-04-25 DIAGNOSIS — I447 Left bundle-branch block, unspecified: Secondary | ICD-10-CM

## 2020-04-25 DIAGNOSIS — Z720 Tobacco use: Secondary | ICD-10-CM

## 2020-04-25 DIAGNOSIS — Q2547 Right aortic arch: Secondary | ICD-10-CM

## 2020-04-25 DIAGNOSIS — Q2549 Other congenital malformations of aorta: Secondary | ICD-10-CM

## 2020-04-25 DIAGNOSIS — C61 Malignant neoplasm of prostate: Secondary | ICD-10-CM

## 2020-04-25 DIAGNOSIS — I728 Aneurysm of other specified arteries: Secondary | ICD-10-CM

## 2020-04-25 DIAGNOSIS — I6529 Occlusion and stenosis of unspecified carotid artery: Secondary | ICD-10-CM

## 2020-04-25 DIAGNOSIS — I10 Essential (primary) hypertension: Secondary | ICD-10-CM

## 2020-04-25 DIAGNOSIS — E782 Mixed hyperlipidemia: Secondary | ICD-10-CM

## 2020-04-25 DIAGNOSIS — E785 Hyperlipidemia, unspecified: Secondary | ICD-10-CM

## 2020-04-25 DIAGNOSIS — F1011 Alcohol abuse, in remission: Secondary | ICD-10-CM

## 2020-04-25 DIAGNOSIS — I2510 Atherosclerotic heart disease of native coronary artery without angina pectoris: Secondary | ICD-10-CM

## 2020-04-25 DIAGNOSIS — I480 Paroxysmal atrial fibrillation: Secondary | ICD-10-CM

## 2020-04-25 NOTE — Progress Notes
Date of Service: 04/25/2020    Shane Roach is a 75 y.o. male.       HPI     Shane Roach was in the [place] clinic today for follow-up regarding coronary disease and paroxysmal atrial fibrillation.  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   ?  He seems to be fine from a cardiovascular standpoint.  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    ?  He denies any chest discomfort, but he has never had typical angina symptoms related to his coronary disease.  We reviewed the findings from his stress test in February.  He has occlusion of a couple of small branches of the right coronary artery that were not considered amenable to stenting.  This was the distribution of abnormality on his stress test and something will need to keep in mind going forward as we do surveillance stress testing.  ?  He denies any palpitations or lightheadedness.  He has had no significant bleeding complications related to oral anticoagulation.  He denies any TIA or stroke symptoms         Vitals:    04/25/20 0922 04/25/20 0925   BP: (!) 162/80 (!) 156/80   BP Source: Arm, Right Upper Arm, Left Upper   Pulse: 50    SpO2: 98%    O2 Device: None (Room air)    PainSc: Zero    Weight: 65.8 kg (145 lb)    Height: 170.2 cm (5' 7)      Body mass index is 22.71 kg/m?[name]     Past Medical History  Patient Active Problem List    Diagnosis Date Noted   ? 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 History of ETOH abuse 03/25/2017   ? AKI (acute kidney injury) (HCC) 03/25/2017   ? Encounter for monitoring sotalol therapy 03/25/2017   ? Paroxysmal atrial fibrillation (HCC) 02/22/2017   ? Screening for cardiovascular condition 07/23/2015     07/10/15:  AAA Duplex at [facility] normal.  No AAA.     ? Hyperlipemia 04/23/2009     09/01/01 - New diagnosis.  Therapy not yet initiated.           ? CAD (coronary artery disease) 04/23/2009     09/01/01 - Left-sided exertional  c/p radiating to throat/L arm.  Eplee Eval>CTchest/.PFT normal. LBBB EKG>>Dr. Jonelle Sports consult  09/06/01 - Cath: EF 50%,  bifurcation LAD/Dx disease, 90% D RCA, PDA,   09/07/01 - 4 Lesion PCI:  3.0 Cypher LAD,  2.5 Cypher Dx, 3.5 Cypher dRCA,  2.5 Express PDA Dr. Ludwig Lean  3/05 - Stress echo: EF 60%, LVH, LBBB, non-ischemic.  6/07 - LAD-LADD restenosis--PCI/stent -Ludwig Lean    07/17/15: Cardiac cath: LAD with 20% ISRS, Diagonal with 50% ISRS ( negative FFR from 0.92 --> 0.89), Lcx free of dz, RCA dominant with 20% mid, 90% ISRS to PDA. Successful DES placed.  30-40% RPW.  LVEDP 11 mmHg  ~ Dual antiplatelet therapy with Aspirin lifelong and Plavix 75 mg daily at least 6 months to 1 year w/o interruption to prevent stent thrombosis and possible myocardial infarction.     07/21/18 Cath: The previously placed stent in the PDA, as well as the PLV branch are completely occluded. 30 to 40% in-stent restenosis in the previously placed stents in proximal to mid LAD. 50% to 60% in-stent restenosis in the previously placed stent in the diagonal.  Normal left ventricular end-diastolic pressure. No significant gradient across the aortic valve on pullback. Continue medical therapy and aggressive control of risk factors.     ? Prostate CA (HCC) 04/23/2009     Seed implantation     ? GI bleed 04/23/2009     Hx Gastric ulcer in 1997. No further issues. Tolerated DAPT in 2007 w/o issues.   ~ Dr. Lance Sell office called with no records of GI procedures.             ? History of tobacco abuse 04/23/2009     8/03 - 1 ppd x 45 years     ? LBBB (left bundle branch block) 04/23/2009   ? 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  07/2014 - CTA showed stable disease     ? Hypertension 04/23/2009   ? Carotid atherosclerosis 11/22/2008     Carotid duplex 11/06/08 [facility]):  50-69% left external carotid.  <30% stenosis in the internal carotid arteries bilaterally  MRI/MRA head 11/06/08 [facility]):  Turbulent flow in the right internal carotid, but no significant stenosis.  No other vascular abnormalities noted.  Posterior circulation normal.     ? Dizziness 11/22/2008     11/05/08:  Acute onset dizziness requiring hospitalization in [place].  MRI/MRA head unremarkable.  CT-head normal.  Discharged on meclizine with diagnosis labyrinthitis.           Review of Systems   Constitutional: Negative.   HENT: Negative.    Eyes: Negative.    Cardiovascular: Negative.    Respiratory: Negative.    Endocrine: Negative.    Hematologic/Lymphatic: Negative.    Skin: Negative.    Musculoskeletal: Negative.    Gastrointestinal: Negative.    Genitourinary: Negative.    Neurological: Negative.    Psychiatric/Behavioral: Negative.    Allergic/Immunologic: Negative.        Physical Exam    Physical Exam   General Appearance: no distress   Skin: warm, no ulcers or xanthomas   Digits and Nails: no cyanosis or clubbing   Eyes: conjunctivae and lids normal, pupils are equal and round   Teeth/Gums/Palate: dentition unremarkable, no lesions   Lips & Oral Mucosa: no pallor or cyanosis   Neck Veins: normal JVP , neck veins are not distended   Thyroid: no nodules, masses, tenderness or enlargement   Chest Inspection: chest is normal in appearance   Respiratory Effort: breathing comfortably, no respiratory distress   Auscultation/Percussion: lungs clear to auscultation, no rales or rhonchi, no wheezing   PMI: PMI not enlarged or displaced   Cardiac Rhythm: regular rhythm and normal rate   Cardiac Auscultation: S1, S2 normal, no rub, no gallop   Murmurs: no murmur   Peripheral Circulation: normal peripheral circulation   Carotid Arteries: normal carotid upstroke bilaterally, no bruits   Radial Arteries: normal symmetric radial pulses   Abdominal Aorta: no abdominal aortic bruit   Pedal Pulses: normal symmetric pedal pulses   Lower Extremity Edema: no lower extremity edema   Abdominal Exam: soft, non-tender, no masses, bowel sounds normal   Liver & Spleen: no organomegaly   Gait &  Station: walks without assistance   Muscle Strength: normal muscle tone   Orientation: oriented to time, place and person   Affect & Mood: appropriate and sustained affect   Language and Memory: patient responsive and seems to comprehend information   Neurologic Exam: neurological assessment grossly intact   Other: moves all extremities      Cardiovascular Health Factors  Vitals BP Readings from Last 3 Encounters:   04/25/20 (!) 156/80   02/27/19 (!) 140/79   07/21/18 (!) 145/93     Wt Readings from Last 3 Encounters:   04/25/20 65.8 kg (145 lb)   02/27/19 68 kg (150 lb)   07/21/18 66.1 kg (145 lb 12.8 oz)     BMI Readings from Last 3 Encounters:   04/25/20 22.71 kg/m?   02/27/19 22.81 kg/m?   07/21/18 22.84 kg/m?      Smoking Social History     Tobacco Use   Smoking Status Former Smoker   Smokeless Tobacco Never Used      Lipid Profile Cholesterol   Date Value Ref Range Status   09/01/2019 111  Final     HDL   Date Value Ref Range Status   09/01/2019 48  Final     LDL   Date Value Ref Range Status   09/01/2019 56  Final     Triglycerides   Date Value Ref Range Status   09/01/2019 35  Final      Blood Sugar No results found for: HGBA1C  Glucose   Date Value Ref Range Status   09/01/2019 96  Final   07/18/2018 97  Final   06/15/2017 138 (H) 70 - 100 MG/DL Final   02/72/5366 440 (H) 65 - 99 mg/dL Final     Comment:                 FASTING REFERENCE INTERVAL   06/13/2005 108 70 - 110 MG/DL Final   34/74/2595 93 70 - 110 MG/DL Final          Problems Addressed Today  Encounter Diagnoses   Name Primary?   ? Primary hypertension Yes   ? Right-sided aortic arch    ? Carotid atherosclerosis, unspecified laterality    ? Aneurysm artery, subclavian (HCC)        Assessment and Plan     Carotid atherosclerosis  No TIA or stroke symptoms.  ?  Hyperlipidemia      LDL treated to goal.  He sees Dr. Andreas Newport once yearly.  ?  CAD (coronary artery disease)  Given his lack of angina symptoms and occupation as a school bus driver I have recommended regular surveillance stress testing.  He has left bundle branch block and will require regadenoson thallium imaging.  Although the recent stress test was mildly abnormal, it shows the perfusion pattern we'd expect, given his anatomy.  ?  Diverticulum of Kommerell  His most recent CTA shows no progression of disease.  He'll be due for another CT-chest with contrast in about a year.  ?  Hypertension  He hasn't been checking his BP recently.  He's always had a white coat component.  The goal for him is 130/80 or less on average.  ?  Paroxysmal atrial fibrillation (HCC)  He is tolerating oral anticoagulation and is asymptomatic.           Current Medications (including today's revisions)  ? apixaban (ELIQUIS) 5 mg tablet Take one tablet by mouth twice daily.   ? aspirin EC 81  mg tablet Take 1 Tab by mouth daily.   ? atorvastatin (LIPITOR) 80 mg tablet Take 80 mg by mouth daily. Indications: pt states he takes this in the morning   ? cetirizine (ZYRTEC) 10 mg tablet Take 10 mg by mouth daily.   ? latanoprost (XALATAN) 0.005 % ophthalmic solution INSTILL 1 DROP INTO BOTH EYES AT BEDTIME AS DIRECTED   ? lisinopril (ZESTRIL) 10 mg tablet TAKE 1 TABLET BY MOUTH EVERY DAY   ? metoprolol tartrate (LOPRESSOR) 25 mg tablet TAKE 1 AND 1/2 TABLETS BY MOUTH TWICE A DAY   ? vitamins, multiple Cap Take 1 Cap by mouth Daily.     Total time spent on today's office visit was 30 minutes.  This includes face-to-face in person visit with patient as well as nonface-to-face time including review of the EMR, outside records, labs, radiologic studies, echocardiogram & other cardiovascular studies, formation of treatment plan, after visit summary, future disposition, and lastly on documentation.

## 2020-05-18 ENCOUNTER — Encounter: Admit: 2020-05-18 | Discharge: 2020-05-18

## 2020-05-18 MED ORDER — METOPROLOL TARTRATE 25 MG PO TAB
ORAL_TABLET | Freq: Two times a day (BID) | 3 refills
Start: 2020-05-18 — End: ?

## 2020-07-10 ENCOUNTER — Encounter: Admit: 2020-07-10 | Discharge: 2020-07-10

## 2020-07-11 ENCOUNTER — Encounter: Admit: 2020-07-11 | Discharge: 2020-07-11

## 2020-07-11 MED ORDER — APIXABAN 5 MG PO TAB
5 mg | ORAL_TABLET | Freq: Two times a day (BID) | ORAL | 3 refills | Status: AC
Start: 2020-07-11 — End: ?

## 2020-07-11 NOTE — Telephone Encounter
Received a request via computer from the patients pharmacy requesting a refill.  Script e-scribed as requested.

## 2020-11-01 ENCOUNTER — Encounter: Admit: 2020-11-01 | Discharge: 2020-11-01

## 2020-11-01 MED ORDER — LISINOPRIL 10 MG PO TAB
ORAL_TABLET | Freq: Every day | 3 refills | Status: AC
Start: 2020-11-01 — End: ?

## 2021-03-19 ENCOUNTER — Encounter: Admit: 2021-03-19 | Discharge: 2021-03-19

## 2021-03-19 DIAGNOSIS — I480 Paroxysmal atrial fibrillation: Secondary | ICD-10-CM

## 2021-03-19 DIAGNOSIS — I10 Essential (primary) hypertension: Secondary | ICD-10-CM

## 2021-03-19 DIAGNOSIS — E782 Mixed hyperlipidemia: Secondary | ICD-10-CM

## 2021-03-19 DIAGNOSIS — I2510 Atherosclerotic heart disease of native coronary artery without angina pectoris: Secondary | ICD-10-CM

## 2021-04-18 ENCOUNTER — Encounter: Admit: 2021-04-18 | Discharge: 2021-04-18

## 2021-04-18 DIAGNOSIS — E782 Mixed hyperlipidemia: Secondary | ICD-10-CM

## 2021-04-18 DIAGNOSIS — I2510 Atherosclerotic heart disease of native coronary artery without angina pectoris: Secondary | ICD-10-CM

## 2021-04-18 DIAGNOSIS — I480 Paroxysmal atrial fibrillation: Secondary | ICD-10-CM

## 2021-04-18 DIAGNOSIS — I10 Essential (primary) hypertension: Secondary | ICD-10-CM

## 2021-04-18 LAB — LIPID PROFILE: CHOLESTEROL: 123

## 2021-04-18 LAB — COMPREHENSIVE METABOLIC PANEL
ALK PHOSPHATASE: 86
ALT: 27
ANION GAP: 8
AST: 27
BLD UREA NITROGEN: 17
CHLORIDE: 103
CO2: 28
CREATININE: 0.9
GLUCOSE,PANEL: 97
POTASSIUM: 4.5
SODIUM: 139

## 2021-04-21 ENCOUNTER — Encounter: Admit: 2021-04-21 | Discharge: 2021-04-21

## 2021-04-22 ENCOUNTER — Encounter: Admit: 2021-04-22 | Discharge: 2021-04-22

## 2021-04-22 NOTE — Telephone Encounter
[facility], can you please let Shane Roach know that all this looks stable for him?      AAA stable.

## 2021-06-02 ENCOUNTER — Encounter: Admit: 2021-06-02 | Discharge: 2021-06-02

## 2021-06-02 MED ORDER — METOPROLOL TARTRATE 25 MG PO TAB
ORAL_TABLET | ORAL | 3 refills | 90.00000 days | Status: AC
Start: 2021-06-02 — End: ?

## 2021-07-09 ENCOUNTER — Encounter: Admit: 2021-07-09 | Discharge: 2021-07-09

## 2021-07-09 MED ORDER — ELIQUIS 5 MG PO TAB
ORAL_TABLET | 1 refills | Status: AC
Start: 2021-07-09 — End: ?

## 2021-08-21 ENCOUNTER — Encounter: Admit: 2021-08-21 | Discharge: 2021-08-21

## 2021-08-21 DIAGNOSIS — I2510 Atherosclerotic heart disease of native coronary artery without angina pectoris: Secondary | ICD-10-CM

## 2021-08-21 DIAGNOSIS — Z720 Tobacco use: Secondary | ICD-10-CM

## 2021-08-21 DIAGNOSIS — I10 Essential (primary) hypertension: Secondary | ICD-10-CM

## 2021-08-21 DIAGNOSIS — I480 Paroxysmal atrial fibrillation: Secondary | ICD-10-CM

## 2021-08-21 DIAGNOSIS — E785 Hyperlipidemia, unspecified: Secondary | ICD-10-CM

## 2021-08-21 DIAGNOSIS — I6529 Occlusion and stenosis of unspecified carotid artery: Secondary | ICD-10-CM

## 2021-08-21 DIAGNOSIS — Q2549 Other congenital malformations of aorta: Secondary | ICD-10-CM

## 2021-08-21 DIAGNOSIS — F1011 Alcohol abuse, in remission: Secondary | ICD-10-CM

## 2021-08-21 DIAGNOSIS — I447 Left bundle-branch block, unspecified: Secondary | ICD-10-CM

## 2021-08-21 DIAGNOSIS — C61 Malignant neoplasm of prostate: Secondary | ICD-10-CM

## 2021-08-21 DIAGNOSIS — E782 Mixed hyperlipidemia: Secondary | ICD-10-CM

## 2021-08-21 NOTE — Progress Notes
Date of Service: 08/21/2021    Shane Roach is a 76 y.o. male.       HPI     Shane Roach was in the [facility] clinic today for follow-up regarding coronary disease and paroxysmal atrial fibrillation. 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   ?  He seems to be fine from a cardiovascular standpoint. 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 ?He had CTA 04/2021 showing stable diameter of the anatomy.  ?  He denies any chest discomfort, but he has never had typical angina symptoms related to his coronary disease. ?His last stress was 02/2020.  He has occlusion of a couple of small branches of the right coronary artery that were not considered amenable to stenting. ?This was the distribution of abnormality on his stress test and something will need to keep in mind going forward as we do surveillance stress testing.  ?  He denies any palpitations or lightheadedness. ?He has had no significant bleeding complications related to oral anticoagulation. ?He denies any TIA or stroke symptoms  ?         Vitals:    08/21/21 1555   BP: 132/80   BP Source: Arm, Left Upper   Pulse: 58   SpO2: 98%   O2 Device: None (Room air)   PainSc: Zero   Weight: 62.6 kg (138 lb)   Height: 170.2 cm (5' 7)     Body mass index is 21.61 kg/m?[name]     Past Medical History  Patient Active Problem List    Diagnosis Date Noted   ? 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 History of ETOH abuse 03/25/2017   ? AKI (acute kidney injury) (HCC) 03/25/2017   ? Encounter for monitoring sotalol therapy 03/25/2017   ? Paroxysmal atrial fibrillation (HCC) 02/22/2017   ? Screening for cardiovascular condition 07/23/2015     07/10/15:  AAA Duplex at [facility] normal.  No AAA.     ? Hyperlipemia 04/23/2009     09/01/01 - New diagnosis.  Therapy not yet initiated.           ? CAD (coronary artery disease) 04/23/2009     09/01/01 - Left-sided exertional  c/p radiating to throat/L arm.  Eplee Eval>CTchest/.PFT normal. LBBB EKG>>Dr. Jonelle Sports consult  09/06/01 - Cath: EF 50%,  bifurcation LAD/Dx disease, 90% D RCA, PDA,   09/07/01 - 4 Lesion PCI:  3.0 Cypher LAD,  2.5 Cypher Dx, 3.5 Cypher dRCA,  2.5 Express PDA Dr. Ludwig Lean  3/05 - Stress echo: EF 60%, LVH, LBBB, non-ischemic.  6/07 - LAD-LADD restenosis--PCI/stent -Ludwig Lean    07/17/15: Cardiac cath: LAD with 20% ISRS, Diagonal with 50% ISRS ( negative FFR from 0.92 --> 0.89), Lcx free of dz, RCA dominant with 20% mid, 90% ISRS to PDA. Successful DES placed.  30-40% RPW.  LVEDP 11 mmHg  ~ Dual antiplatelet therapy with Aspirin lifelong and Plavix 75 mg daily at least 6 months to 1 year w/o interruption to prevent stent thrombosis and possible myocardial infarction.     07/21/18 Cath: The previously placed stent in the PDA, as well as the PLV branch are completely occluded. 30 to 40% in-stent restenosis in the previously placed stents in proximal to mid LAD. 50% to 60% in-stent restenosis in the previously placed stent in the diagonal. Normal left ventricular end-diastolic pressure. No significant gradient  across the aortic valve on pullback. Continue medical therapy and aggressive control of risk factors.     ? Prostate CA (HCC) 04/23/2009     Seed implantation     ? GI bleed 04/23/2009     Hx Gastric ulcer in 1997. No further issues. Tolerated DAPT in 2007 w/o issues.   ~ Dr. Lance Sell office called with no records of GI procedures.             ? History of tobacco abuse 04/23/2009     8/03 - 1 ppd x 45 years     ? LBBB (left bundle branch block) 04/23/2009   ? 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  07/2014 - CTA showed stable disease     ? Hypertension 04/23/2009   ? Carotid atherosclerosis 11/22/2008     Carotid duplex 11/06/08 [facility]):  50-69% left external carotid.  <30% stenosis in the internal carotid arteries bilaterally  MRI/MRA head 11/06/08 [facility]):  Turbulent flow in the right internal carotid, but no significant stenosis.  No other vascular abnormalities noted.  Posterior circulation normal.     ? Dizziness 11/22/2008     11/05/08:  Acute onset dizziness requiring hospitalization in [place].  MRI/MRA head unremarkable.  CT-head normal.  Discharged on meclizine with diagnosis labyrinthitis.           Review of Systems   Constitutional: Negative.   HENT: Negative.    Eyes: Negative.    Cardiovascular: Negative.    Respiratory: Negative.    Endocrine: Negative.    Hematologic/Lymphatic: Negative.    Skin: Negative.    Musculoskeletal: Negative.    Gastrointestinal: Negative.    Genitourinary: Negative.    Neurological: Negative.    Psychiatric/Behavioral: Negative.    Allergic/Immunologic: Negative.        Physical Exam    Physical Exam   General Appearance: no distress   Skin: warm, no ulcers or xanthomas   Digits and Nails: no cyanosis or clubbing   Eyes: conjunctivae and lids normal, pupils are equal and round   Teeth/Gums/Palate: dentition unremarkable, no lesions   Lips & Oral Mucosa: no pallor or cyanosis   Neck Veins: normal JVP , neck veins are not distended   Thyroid: no nodules, masses, tenderness or enlargement   Chest Inspection: chest is normal in appearance   Respiratory Effort: breathing comfortably, no respiratory distress   Auscultation/Percussion: lungs clear to auscultation, no rales or rhonchi, no wheezing   PMI: PMI not enlarged or displaced   Cardiac Rhythm: regular rhythm and normal rate   Cardiac Auscultation: S1, S2 normal, no rub, no gallop   Murmurs: no murmur   Peripheral Circulation: normal peripheral circulation   Carotid Arteries: normal carotid upstroke bilaterally, no bruits   Radial Arteries: normal symmetric radial pulses   Abdominal Aorta: no abdominal aortic bruit   Pedal Pulses: normal symmetric pedal pulses   Lower Extremity Edema: no lower extremity edema   Abdominal Exam: soft, non-tender, no masses, bowel sounds normal   Liver & Spleen: no organomegaly   Gait & Station: walks without assistance   Muscle Strength:  normal muscle tone   Orientation: oriented to time, place and person   Affect & Mood: appropriate and sustained affect   Language and Memory: patient responsive and seems to comprehend information   Neurologic Exam: neurological assessment grossly intact   Other: moves all extremities      Cardiovascular Health Factors  Vitals BP Readings from Last 3 Encounters:   08/21/21 132/80   04/25/20 (!) 156/80   02/27/19 (!) 140/79     Wt Readings from Last 3 Encounters:   08/21/21 62.6 kg (138 lb)   04/25/20 65.8 kg (145 lb)   02/27/19 68 kg (150 lb)     BMI Readings from Last 3 Encounters:   08/21/21 21.61 kg/m?   04/25/20 22.71 kg/m?   02/27/19 22.81 kg/m?      Smoking Social History     Tobacco Use   Smoking Status Former   Smokeless Tobacco Never   Vaping Use   ? Vaping Use: Never used      Lipid Profile Cholesterol   Date Value Ref Range Status   04/18/2021 123  Final     HDL   Date Value Ref Range Status   04/18/2021 51  Final     LDL   Date Value Ref Range Status   04/18/2021 65  Final     Triglycerides   Date Value Ref Range Status   04/18/2021 35  Final      Blood Sugar No results found for: HGBA1C  Glucose   Date Value Ref Range Status   04/18/2021 97  Final   09/01/2019 96  Final   07/18/2018 97  Final   01/01/2006 112 (H) 65 - 99 mg/dL Final     Comment:                 FASTING REFERENCE INTERVAL   06/13/2005 108 70 - 110 MG/DL Final   16/10/9602 93 70 - 110 MG/DL Final          Problems Addressed Today  No diagnosis found.    Assessment and Plan     Carotid atherosclerosis  No TIA or stroke symptoms.  ?  Hyperlipidemia   ?  LDL treated to goal. ?He sees Dr. Andreas Newport once yearly.  ?  CAD (coronary artery disease)  Given his lack of angina symptoms and occupation as a school bus driver I have recommended regular surveillance stress testing. ?He has left bundle branch block and will require regadenoson thallium imaging.  Although the recent stress test was mildly abnormal, it shows the perfusion pattern we'd expect, given his anatomy.  We'll set up his next stress test some time in mid-2024.  ?  Diverticulum of Kommerell  His most recent CTA shows no progression of disease.  He'll be due for another CT-chest with contrast in about two years.  ?  Hypertension  He hasn't been checking his BP recently.  He's always had a white coat component.  The goal for him is 130/80 or less on average.  ?  Paroxysmal atrial fibrillation (HCC)  He is tolerating oral anticoagulation and is asymptomatic         Current Medications (including today's revisions)  ? aspirin EC 81 mg tablet Take 1 Tab by mouth daily.   ? atorvastatin (LIPITOR) 80 mg tablet Take one tablet by mouth daily. Indications: pt states he takes this in the morning   ? cetirizine (ZYRTEC) 10 mg tablet Take one tablet by mouth daily.   ? ELIQUIS  5 mg tablet TAKE 1 TABLET BY MOUTH TWICE A DAY   ? latanoprost (XALATAN) 0.005 % ophthalmic solution INSTILL 1 DROP INTO BOTH EYES AT BEDTIME AS DIRECTED   ? lisinopril (ZESTRIL) 10 mg tablet TAKE 1 TABLET BY MOUTH EVERY DAY   ? metoprolol tartrate 25 mg tablet TAKE 1 AND 1/2 TABLETS BY MOUTH TWICE A DAY   ? vitamins, multiple Cap Take one capsule by mouth daily.     Total time spent on today's office visit was 45 minutes.  This includes face-to-face in person visit with patient as well as nonface-to-face time including review of the EMR, outside records, labs, radiologic studies, echocardiogram & other cardiovascular studies, formation of treatment plan, after visit summary, future disposition, and lastly on documentation.

## 2021-10-19 ENCOUNTER — Encounter: Admit: 2021-10-19 | Discharge: 2021-10-19

## 2021-10-19 MED ORDER — LISINOPRIL 10 MG PO TAB
ORAL_TABLET | 3 refills
Start: 2021-10-19 — End: ?

## 2022-01-15 ENCOUNTER — Encounter: Admit: 2022-01-15 | Discharge: 2022-01-15

## 2022-01-15 MED ORDER — ELIQUIS 5 MG PO TAB
ORAL_TABLET | 11 refills | Status: AC
Start: 2022-01-15 — End: ?

## 2022-03-02 ENCOUNTER — Encounter: Admit: 2022-03-02 | Discharge: 2022-03-02

## 2022-03-02 NOTE — Telephone Encounter
Dr Cletus Gash rt inguinal hernia repair needs to hold eliquis Apolonio Schneiders [phone] fx [phone]

## 2022-03-20 ENCOUNTER — Ambulatory Visit: Admit: 2022-03-20 | Discharge: 2022-03-20

## 2022-03-20 ENCOUNTER — Encounter: Admit: 2022-03-20 | Discharge: 2022-03-20

## 2022-03-20 DIAGNOSIS — I2510 Atherosclerotic heart disease of native coronary artery without angina pectoris: Secondary | ICD-10-CM

## 2022-03-20 MED ORDER — RP DX TC-99M TETROFOSMIN MCI
8 | Freq: Once | INTRAVENOUS | 0 refills | Status: CP
Start: 2022-03-20 — End: ?

## 2022-03-20 MED ORDER — RP DX TC-99M TETROFOSMIN MCI
24 | Freq: Once | INTRAVENOUS | 0 refills | Status: CP
Start: 2022-03-20 — End: ?

## 2022-03-20 MED ORDER — EUCALYPTUS-MENTHOL MM LOZG
1 | Freq: Once | ORAL | 0 refills | Status: AC | PRN
Start: 2022-03-20 — End: ?

## 2022-03-20 MED ORDER — ALBUTEROL SULFATE 90 MCG/ACTUATION IN HFAA
2 | RESPIRATORY_TRACT | 0 refills | Status: AC | PRN
Start: 2022-03-20 — End: ?

## 2022-03-20 MED ORDER — SODIUM CHLORIDE 0.9 % IV SOLP
250 mL | INTRAVENOUS | 0 refills | Status: AC | PRN
Start: 2022-03-20 — End: ?

## 2022-03-20 MED ORDER — REGADENOSON 0.4 MG/5 ML IV SYRG
.4 mg | Freq: Once | INTRAVENOUS | 0 refills | Status: CP
Start: 2022-03-20 — End: ?

## 2022-03-20 MED ORDER — AMINOPHYLLINE 25 MG/ML IV SOLN
50 mg | INTRAVENOUS | 0 refills | Status: AC | PRN
Start: 2022-03-20 — End: ?

## 2022-03-20 MED ORDER — NITROGLYCERIN 0.4 MG SL SUBL
.4 mg | SUBLINGUAL | 0 refills | Status: AC | PRN
Start: 2022-03-20 — End: ?

## 2022-03-24 ENCOUNTER — Encounter: Admit: 2022-03-24 | Discharge: 2022-03-24

## 2022-03-25 ENCOUNTER — Encounter: Admit: 2022-03-25 | Discharge: 2022-03-25

## 2022-03-25 NOTE — Telephone Encounter
-----   Message from Michiel Cowboy, MD sent at 03/24/2022  9:07 AM CDT -----  This perfusion study is consistent with the findings on the most recent cath in 2020 showing occlusion of stented segments of the PL and PD branches of the RCA.  I don't think we need to pursue another cath at this point.    Cc:  Dr. Jaclyn Shaggy

## 2022-03-25 NOTE — Telephone Encounter
03/25/22 - Records requested from Dr Cletus Gash [phone] per Shirley Muscat note below lkg    --Dr. Germain Osgood, [place] [phone]--

## 2022-06-05 ENCOUNTER — Encounter: Admit: 2022-06-05 | Discharge: 2022-06-05

## 2022-06-05 MED ORDER — METOPROLOL TARTRATE 25 MG PO TAB
ORAL_TABLET | ORAL | 3 refills | 90.00000 days | Status: AC
Start: 2022-06-05 — End: ?

## 2022-08-12 NOTE — Assessment & Plan Note
Lab Results   Component Value Date    CHOL 123 04/18/2021    TRIG 35 04/18/2021    HDL 51 04/18/2021    LDL 65 04/18/2021    VLDL 7 04/18/2021    NONHDLCHOL 65 07/21/2018    CHOLHDLC 2 04/18/2021      LDL treated to goal.

## 2022-08-12 NOTE — Assessment & Plan Note
Stress test earlier this year consistent with known anatomy--occluded, large RCA.

## 2022-08-12 NOTE — Assessment & Plan Note
No TIA symptoms. 

## 2022-08-13 ENCOUNTER — Encounter: Admit: 2022-08-13 | Discharge: 2022-08-13

## 2022-08-13 DIAGNOSIS — Z720 Tobacco use: Secondary | ICD-10-CM

## 2022-08-13 DIAGNOSIS — Q2549 Other congenital malformations of aorta: Secondary | ICD-10-CM

## 2022-08-13 DIAGNOSIS — E782 Mixed hyperlipidemia: Secondary | ICD-10-CM

## 2022-08-13 DIAGNOSIS — I447 Left bundle-branch block, unspecified: Secondary | ICD-10-CM

## 2022-08-13 DIAGNOSIS — F1011 Alcohol abuse, in remission: Secondary | ICD-10-CM

## 2022-08-13 DIAGNOSIS — Z9229 Personal history of other drug therapy: Secondary | ICD-10-CM

## 2022-08-13 DIAGNOSIS — I6529 Occlusion and stenosis of unspecified carotid artery: Secondary | ICD-10-CM

## 2022-08-13 DIAGNOSIS — J449 Chronic obstructive pulmonary disease, unspecified: Secondary | ICD-10-CM

## 2022-08-13 DIAGNOSIS — I729 Aneurysm of unspecified site: Secondary | ICD-10-CM

## 2022-08-13 DIAGNOSIS — I2510 Atherosclerotic heart disease of native coronary artery without angina pectoris: Secondary | ICD-10-CM

## 2022-08-13 DIAGNOSIS — C61 Malignant neoplasm of prostate: Secondary | ICD-10-CM

## 2022-08-13 DIAGNOSIS — E785 Hyperlipidemia, unspecified: Secondary | ICD-10-CM

## 2022-08-13 DIAGNOSIS — I10 Essential (primary) hypertension: Secondary | ICD-10-CM

## 2022-08-13 DIAGNOSIS — I480 Paroxysmal atrial fibrillation: Secondary | ICD-10-CM

## 2022-08-13 NOTE — Progress Notes
Date of Service: 08/13/2022    Shane Roach is a 77 y.o. male.       HPI     Aijalon was in the [place] clinic today for follow-up regarding coronary disease and paroxysmal atrial fibrillation.  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      He seems to be fine from a cardiovascular standpoint.  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  He had CTA 04/2021 showing stable diameter of the anatomy.     He denies any chest discomfort, but he has never had typical angina symptoms related to his coronary disease.  His last stress was in March, 2024.  He has occlusion of the right coronary artery that was not considered amenable to stenting.  This was the distribution of abnormality on his stress test and something will need to keep in mind going forward as we do surveillance stress testing.     He denies any palpitations or lightheadedness.  He has had no significant bleeding complications related to oral anticoagulation.  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  No AAA.      Hyperlipemia 04/23/2009     09/01/01 - New diagnosis.  Therapy not yet initiated.            CAD (coronary artery disease) 04/23/2009     09/01/01 - Left-sided exertional  c/p radiating to throat/L arm.  Eplee Eval>CTchest/.PFT normal. LBBB EKG>>Dr. Jonelle Sports consult  09/06/01 - Cath: EF 50%,  bifurcation LAD/Dx disease, 90% D RCA, PDA,   09/07/01 - 4 Lesion PCI:  3.0 Cypher LAD,  2.5 Cypher Dx, 3.5 Cypher dRCA,  2.5 Express PDA Dr. Ludwig Lean  3/05 - Stress echo: EF 60%, LVH, LBBB, non-ischemic.  6/07 - LAD-LADD restenosis--PCI/stent -Ludwig Lean    07/17/15: Cardiac cath: LAD with 20% ISRS, Diagonal with 50% ISRS ( negative FFR from 0.92 --> 0.89), Lcx free of dz, RCA dominant with 20% mid, 90% ISRS to PDA. Successful DES placed.  30-40% RPW.  LVEDP 11 mmHg  ~ Dual antiplatelet therapy with Aspirin lifelong and Plavix 75 mg daily at least 6 months to 1 year w/o interruption to prevent stent thrombosis and possible myocardial infarction.     07/21/18 Cath: The previously placed stent in the PDA, as well as the PLV branch are completely occluded. 30 to 40% in-stent restenosis in the previously placed stents in proximal to mid LAD. 50% to 60% in-stent restenosis in the previously placed stent in  the diagonal. Normal left ventricular end-diastolic pressure. No significant gradient across the aortic valve on pullback. Continue medical therapy and aggressive control of risk factors.      Prostate CA (HCC) 04/23/2009     Seed implantation      GI bleed 04/23/2009     Hx Gastric ulcer in 1997. No further issues. Tolerated DAPT in 2007 w/o issues.   ~ Dr. Lance Sell office called with no records of GI procedures.              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  07/2014 - CTA showed stable disease      Hypertension 04/23/2009    Carotid atherosclerosis 11/22/2008     Carotid duplex 11/06/08 [facility]):  50-69% left external carotid.  <30% stenosis in the internal carotid arteries bilaterally  MRI/MRA head 11/06/08 [facility]):  Turbulent flow in the right internal carotid, but no significant stenosis.  No other vascular abnormalities noted.  Posterior circulation normal.      Dizziness 11/22/2008     11/05/08:  Acute onset dizziness requiring hospitalization in [place].  MRI/MRA head unremarkable.  CT-head normal.  Discharged on meclizine with diagnosis labyrinthitis.           Review of Systems   Constitutional: Negative.   HENT: Negative.     Eyes: Negative.    Cardiovascular: Negative.    Respiratory: Negative.     Endocrine: Negative.    Hematologic/Lymphatic: Negative.    Skin: Negative.    Musculoskeletal: Negative.    Gastrointestinal: Negative.    Genitourinary: Negative.    Neurological: Negative.    Psychiatric/Behavioral: Negative.     Allergic/Immunologic: Negative.        Physical Exam    Physical Exam   General Appearance: no distress   Skin: warm, no ulcers or xanthomas   Digits and Nails: no cyanosis or clubbing   Eyes: conjunctivae and lids normal, pupils are equal and round   Teeth/Gums/Palate: dentition unremarkable, no lesions   Lips & Oral Mucosa: no pallor or cyanosis   Neck Veins: normal JVP , neck veins are not distended   Thyroid: no nodules, masses, tenderness or enlargement   Chest Inspection: chest is normal in appearance   Respiratory Effort: breathing comfortably, no respiratory distress   Auscultation/Percussion: lungs clear to auscultation, no rales or rhonchi, no wheezing   PMI: PMI not enlarged or displaced   Cardiac Rhythm: regular rhythm and normal rate   Cardiac Auscultation: S1, S2 normal, no rub, no gallop   Murmurs: no murmur   Peripheral Circulation: normal peripheral circulation Carotid Arteries: normal carotid upstroke bilaterally, no bruits   Radial Arteries: normal symmetric radial pulses   Abdominal Aorta: no abdominal aortic bruit   Pedal Pulses: normal symmetric pedal pulses   Lower Extremity Edema: no lower extremity edema   Abdominal Exam: soft, non-tender, no masses, bowel sounds normal   Liver & Spleen: no organomegaly  Gait & Station: walks without assistance   Muscle Strength: normal muscle tone   Orientation: oriented to time, place and person   Affect & Mood: appropriate and sustained affect   Language and Memory: patient responsive and seems to comprehend information   Neurologic Exam: neurological assessment grossly intact   Other: moves all extremities      Cardiovascular Health Factors  Vitals BP Readings from Last 3 Encounters:   08/13/22 (!) 140/68   08/21/21 132/80   04/25/20 (!) 156/80     Wt Readings from Last 3 Encounters:   08/13/22 58.2 kg (128 lb 6.4 oz)   08/21/21 62.6 kg (138 lb)   04/25/20 65.8 kg (145 lb)     BMI Readings from Last 3 Encounters:   08/13/22 20.11 kg/m?   08/21/21 21.61 kg/m?   04/25/20 22.71 kg/m?      Smoking Social History     Tobacco Use   Smoking Status Former    Current packs/day: 0.00    Average packs/day: 1 pack/day for 30.0 years (30.0 ttl pk-yrs)    Types: Cigarettes    Quit date: 01/13/2008    Years since quitting: 14.5   Smokeless Tobacco Never      Lipid Profile Cholesterol   Date Value Ref Range Status   04/18/2021 123  Final     HDL   Date Value Ref Range Status   04/18/2021 51  Final     LDL   Date Value Ref Range Status   04/18/2021 65  Final     Triglycerides   Date Value Ref Range Status   04/18/2021 35  Final      Blood Sugar No results found for: HGBA1C  Glucose   Date Value Ref Range Status   01/19/2022 100  Final   04/18/2021 97  Final   09/01/2019 96  Final   01/01/2006 112 (H) 65 - 99 mg/dL Final     Comment:                 FASTING REFERENCE INTERVAL   06/13/2005 108 70 - 110 MG/DL Final   16/10/9602 93 70 - 110 MG/DL Final          Problems Addressed Today  Encounter Diagnoses   Name Primary?    Coronary artery disease involving native coronary artery, unspecified whether angina present, unspecified whether native or transplanted heart Yes    Carotid atherosclerosis, unspecified laterality     Mixed hyperlipidemia     Diverticulum of Kommerell        Assessment and Plan       CAD (coronary artery disease)  Stress test earlier this year consistent with known anatomy--occluded, large RCA.      Carotid atherosclerosis  No TIA symptoms.    Hyperlipemia  Lab Results   Component Value Date    CHOL 123 04/18/2021    TRIG 35 04/18/2021    HDL 51 04/18/2021    LDL 65 04/18/2021    VLDL 7 04/18/2021    NONHDLCHOL 65 07/21/2018    CHOLHDLC 2 04/18/2021      LDL treated to goal.    Diverticulum of Kommerell  Last CTA was in 2021 and showed stable thoracic and great vessel anatomy.    Current Medications (including today's revisions)   apixaban (ELIQUIS) 5 mg tablet TAKE 1 TABLET BY MOUTH TWICE A DAY    aspirin EC 81 mg tablet Take 1 Tab by mouth daily.    atorvastatin (LIPITOR)  80 mg tablet Take one tablet by mouth daily. Indications: pt states he takes this in the morning    cetirizine (ZYRTEC) 10 mg tablet Take one tablet by mouth daily.    latanoprost (XALATAN) 0.005 % ophthalmic solution INSTILL 1 DROP INTO BOTH EYES AT BEDTIME AS DIRECTED    lisinopriL (ZESTRIL) 10 mg tablet TAKE 1 TABLET BY MOUTH EVERY DAY    metoprolol tartrate 25 mg tablet TAKE 1 AND 1/2 TABLETS BY MOUTH TWICE A DAY    vitamins, multiple Cap Take one capsule by mouth daily.     Total time spent on today's office visit was 45 minutes.  This includes face-to-face in person visit with patient as well as nonface-to-face time including review of the EMR, outside records, labs, radiologic studies, echocardiogram & other cardiovascular studies, formation of treatment plan, after visit summary, future disposition, and lastly on documentation.

## 2022-08-13 NOTE — Assessment & Plan Note
Lisinopril 10/day and metoprolol tartrate 25 BID.  He doesn't check at home.

## 2022-08-13 NOTE — Assessment & Plan Note
Cardioversion in 2019.

## 2022-10-09 ENCOUNTER — Encounter: Admit: 2022-10-09 | Discharge: 2022-10-09

## 2022-10-09 MED ORDER — LISINOPRIL 10 MG PO TAB
ORAL_TABLET | 3 refills | Status: AC
Start: 2022-10-09 — End: ?

## 2023-01-22 ENCOUNTER — Encounter: Admit: 2023-01-22 | Discharge: 2023-01-22

## 2023-01-22 MED ORDER — ELIQUIS 5 MG PO TAB
ORAL_TABLET | 11 refills | Status: AC
Start: 2023-01-22 — End: ?

## 2023-01-26 ENCOUNTER — Encounter: Admit: 2023-01-26 | Discharge: 2023-01-26

## 2023-05-08 ENCOUNTER — Encounter: Admit: 2023-05-08 | Discharge: 2023-05-08

## 2023-05-29 ENCOUNTER — Encounter: Admit: 2023-05-29 | Discharge: 2023-05-29

## 2023-09-06 ENCOUNTER — Encounter: Admit: 2023-09-06 | Discharge: 2023-09-06

## 2023-09-10 ENCOUNTER — Encounter: Admit: 2023-09-10 | Discharge: 2023-09-10

## 2023-09-14 ENCOUNTER — Encounter: Admit: 2023-09-14 | Discharge: 2023-09-14

## 2023-09-14 ENCOUNTER — Ambulatory Visit: Admit: 2023-09-14 | Discharge: 2023-09-14 | Payer: MEDICARE

## 2023-09-14 DIAGNOSIS — I2510 Atherosclerotic heart disease of native coronary artery without angina pectoris: Principal | ICD-10-CM

## 2023-09-14 DIAGNOSIS — E782 Mixed hyperlipidemia: Secondary | ICD-10-CM

## 2023-09-14 DIAGNOSIS — R55 Syncope and collapse: Secondary | ICD-10-CM

## 2023-09-14 DIAGNOSIS — R42 Dizziness and giddiness: Secondary | ICD-10-CM

## 2023-09-14 DIAGNOSIS — I10 Essential (primary) hypertension: Secondary | ICD-10-CM

## 2023-09-14 DIAGNOSIS — Q2549 Other congenital malformations of aorta: Secondary | ICD-10-CM

## 2023-09-14 DIAGNOSIS — I480 Paroxysmal atrial fibrillation: Secondary | ICD-10-CM

## 2023-09-14 NOTE — Patient Instructions
 1/4 teaspoon salt in a small glass of water will expand your blood volume and maintain BP on hot days.

## 2023-09-14 NOTE — Progress Notes
 Date of Service: 09/14/2023    Shane Roach is a 78 y.o. male.       HPI     Shane Roach was in the [place] clinic today for follow-up regarding coronary disease and paroxysmal atrial fibrillation.  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    He was in the [place] ED a couple of days ago with complete exhaustion and light headedness.  He'd been under stress with a bus full of grade school children this year and quite extreme heat on the bus while driving early in the school year.  His work-up was pretty much negative, but he was having a lot of PVCs on telemetry and follow-up here was recommended.    Since then he denies any chest discomfort, but he has never had typical angina symptoms related to his coronary disease.  His last stress was in March, 2024.  He has occlusion of the right coronary artery that was not considered amenable to stenting.  This was the distribution of abnormality on his stress imaging.     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  He had CTA 04/2021 showing stable diameter of the anatomy.    He's had      He denies any palpitations or lightheadedness.  He has had no significant bleeding complications related to oral anticoagulation.  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 He was started on Eliquis  and metoprolol  and underwent TEE-guided cardioversion in February, 2019. He had early recurrence of AF and was started on sotalol  in March, 2019. He had more AF on sotalol  and eventually underwent AF ablation by Dr. Liborio in June, 2019. The sotalol  was discontinued in August, 2019, and he hasn't had any known recurrence of AF. He's remained on Eliquis  ever since then.       Screening for cardiovascular condition 07/23/2015     07/10/15:  AAA Duplex at [facility] normal.  No AAA.      Hyperlipemia 04/23/2009     09/01/01 - New diagnosis.  Therapy not yet initiated.            CAD (coronary artery disease) 04/23/2009     09/01/01 - Left-sided exertional  c/p radiating to throat/L arm.  Eplee Eval>CTchest/.PFT normal. LBBB EKG>>Dr. GORMAN Balloon consult  09/06/01 - Cath: EF 50%,  bifurcation LAD/Dx disease, 90% D RCA, PDA,   09/07/01 - 4 Lesion PCI:  3.0  Cypher LAD,  2.5 Cypher Dx, 3.5 Cypher dRCA,  2.5 Express PDA Dr. Zorita  3/05 - Stress echo: EF 60%, LVH, LBBB, non-ischemic.  6/07 - LAD-LADD restenosis--PCI/stent -Zorita    07/17/15: Cardiac cath: LAD with 20% ISRS, Diagonal with 50% ISRS ( negative FFR from 0.92 --> 0.89), Lcx free of dz, RCA dominant with 20% mid, 90% ISRS to PDA. Successful DES placed.  30-40% RPW.  LVEDP 11 mmHg  ~ Dual antiplatelet therapy with Aspirin  lifelong and Plavix 75 mg daily at least 6 months to 1 year w/o interruption to prevent stent thrombosis and possible myocardial infarction.     07/21/18 Cath: The previously placed stent in the PDA, as well as the PLV branch are completely occluded. 30 to 40% in-stent restenosis in the previously placed stents in proximal to mid LAD. 50% to 60% in-stent restenosis in the previously placed stent in the diagonal. Normal left ventricular end-diastolic pressure. No significant gradient across the aortic valve on pullback. Continue medical therapy and aggressive control of risk factors.      Prostate CA (CMS-HCC) 04/23/2009     Seed implantation      GI bleed 04/23/2009     Hx Gastric ulcer in 1997. No further issues. Tolerated DAPT in 2007 w/o issues.   ~ Dr. Wesley office called with no records of GI procedures.              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  07/2014 - CTA showed stable disease  04/2021 - CTA showed stable diameter      Hypertension 04/23/2009    Carotid atherosclerosis 11/22/2008     Carotid duplex 11/06/08 [facility]):  50-69% left external carotid.  <30% stenosis in the internal carotid arteries bilaterally  MRI/MRA head 11/06/08 [facility]):  Turbulent flow in the right internal carotid, but no significant stenosis.  No other vascular abnormalities noted.  Posterior circulation normal.      Dizziness 11/22/2008     11/05/08:  Acute onset dizziness requiring hospitalization in [place].  MRI/MRA head unremarkable.  CT-head normal.  Discharged on meclizine with diagnosis labyrinthitis.           Review of Systems   Constitutional: Negative.   HENT: Negative.     Eyes: Negative.    Cardiovascular: Negative.    Respiratory: Negative.     Endocrine: Negative.    Hematologic/Lymphatic: Negative.    Skin: Negative.    Musculoskeletal: Negative.    Gastrointestinal: Negative.    Genitourinary: Negative.    Neurological: Negative.    Psychiatric/Behavioral: Negative.     Allergic/Immunologic: Negative. Physical Exam    Physical Exam   General Appearance: no distress   Skin: warm, no ulcers or xanthomas   Digits and Nails: no cyanosis or clubbing   Eyes: conjunctivae and lids normal, pupils are equal and round   Teeth/Gums/Palate: dentition unremarkable, no lesions   Lips & Oral Mucosa: no pallor or  cyanosis   Neck Veins: normal JVP , neck veins are not distended   Thyroid: no nodules, masses, tenderness or enlargement   Chest Inspection: chest is normal in appearance   Respiratory Effort: breathing comfortably, no respiratory distress   Auscultation/Percussion: lungs clear to auscultation, no rales or rhonchi, no wheezing   PMI: PMI not enlarged or displaced   Cardiac Rhythm: regular rhythm and normal rate   Cardiac Auscultation: S1, S2 normal, no rub, no gallop   Murmurs: no murmur   Peripheral Circulation: normal peripheral circulation   Carotid Arteries: normal carotid upstroke bilaterally, no bruits   Radial Arteries: normal symmetric radial pulses   Abdominal Aorta: no abdominal aortic bruit   Pedal Pulses: normal symmetric pedal pulses   Lower Extremity Edema: no lower extremity edema   Abdominal Exam: soft, non-tender, no masses, bowel sounds normal   Liver & Spleen: no organomegaly   Gait & Station: walks without assistance   Muscle Strength: normal muscle tone   Orientation: oriented to time, place and person   Affect & Mood: appropriate and sustained affect   Language and Memory: patient responsive and seems to comprehend information   Neurologic Exam: neurological assessment grossly intact   Other: moves all extremities      Cardiovascular Health Factors  Vitals BP Readings from Last 3 Encounters:   09/14/23 (!) 151/90   08/13/22 (!) 140/68   08/21/21 132/80     Wt Readings from Last 3 Encounters:   09/14/23 57.9 kg (127 lb 9.6 oz)   08/13/22 58.2 kg (128 lb 6.4 oz)   08/21/21 62.6 kg (138 lb)     BMI Readings from Last 3 Encounters:   09/14/23 19.98 kg/m?   08/13/22 20.11 kg/m?   08/21/21 21.61 kg/m?      Smoking Tobacco Use History[1]   Lipid Profile Cholesterol   Date Value Ref Range Status   03/01/2023 117  Final     HDL   Date Value Ref Range Status   03/01/2023 49  Final     LDL   Date Value Ref Range Status   03/01/2023 61  Final     Triglycerides   Date Value Ref Range Status   03/01/2023 38  Final      Blood Sugar No results found for: HGBA1C  Glucose   Date Value Ref Range Status   09/05/2023 138 (H) 70 - 105 Final   01/19/2022 100  Final   04/18/2021 97  Final          Problems Addressed Today  Encounter Diagnoses   Name Primary?    Coronary artery disease involving native coronary artery, unspecified whether angina present, unspecified whether native or transplanted heart Yes    Primary hypertension     Paroxysmal atrial fibrillation (CMS-HCC)     Dizziness     Near syncope     Mixed hyperlipidemia     Diverticulum of Kommerell        Assessment and Plan     CAD (coronary artery disease)  Stress test earlier this year consistent with known anatomy--occluded, large RCA.       Carotid atherosclerosis  No TIA symptoms.     Hyperlipemia        Lab Results   Component Value Date     CHOL 123 04/18/2021     TRIG 35 04/18/2021     HDL 51 04/18/2021     LDL 65 04/18/2021     VLDL 7 04/18/2021  NONHDLCHOL 65 07/21/2018     CHOLHDLC 2 04/18/2021      LDL treated to goal.     Diverticulum of Kommerell  Last CTA was in 2021 and showed stable thoracic and great vessel anatomy.  Hypertension  Lisinopril  10/day and metoprolol  tartrate 25 BID.  He doesn't check at home.                     Paroxysmal atrial fibrillation (CMS-HCC)  AF first diagnosed in 2019 when he presented with a diastolic heart failure picture.  He was started on Eliquis  and metoprolol  and underwent TEE-guided cardioversion in February, 2019.  He had early recurrence of AF and was started on sotalol  in March, 2019.  He had more AF on sotalol  and eventually underwent AF ablation by Dr. Liborio in June, 2019.  The sotalol  was discontinued in  August, 2019, and he hasn't had any known recurrence of AF.  He's remained on Eliquis  ever since then.    The Eliquis  is prohibitively expensive for him and I think there is a real question as to whether he's even having any AF anymore.  I've ordered a 2-week Zio and if positive for PAF would consider a referral for [facility], depending on the findings.  Alternatively if we don't see any AF on the monitor we might consider an ILR to screen for recurrent AF on a more long-term basis.        CAD (coronary artery disease)  First LAD and RCA stent procedure in 2003.  Stenting of LADD in 2007.  Then DES to RPD in 2017.  Most recent coronary arteriogram in 2020 showed occlusion of the RPD stent, mild/moderate in-stent restenosis of both LAD and diagonal stented segments.    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    Hyperlipemia  Lab Results   Component Value Date    CHOL 117 03/01/2023    TRIG 38 03/01/2023    HDL 49 03/01/2023    LDL 61 03/01/2023    VLDL 8 03/01/2023    NONHDLCHOL 65 07/21/2018    CHOLHDLC 2 03/01/2023      Atorva 80 mg/day.    Diverticulum of Kommerell  Last CTA was in 2023 and showed stable thoracic and great vessel anatomy.   This anatomy has really been quite stable dating back to original diagnosis in 2008.      Current Medications (including today's revisions)   aspirin  EC 81 mg tablet Take 1 Tab by mouth daily.    atorvastatin  (LIPITOR) 80 mg tablet Take one tablet by mouth daily. Indications: pt states he takes this in the morning    cetirizine HCl (ZYRTEC PO) Take  by mouth.    ELIQUIS  5 mg tablet TAKE 1 TABLET BY MOUTH TWICE A DAY    latanoprost (XALATAN) 0.005 % ophthalmic solution INSTILL 1 DROP INTO BOTH EYES AT BEDTIME AS DIRECTED    lisinopriL  (ZESTRIL ) 10 mg tablet TAKE 1 TABLET BY MOUTH EVERY DAY    metoprolol  tartrate 25 mg tablet TAKE 1 AND 1/2 TABLETS BY MOUTH TWICE A DAY    vitamins, multiple Cap Take one capsule by mouth daily.     Total time spent on today's office visit was 45 minutes.  This includes face-to-face in person visit with patient as well as nonface-to-face time including review  of the EMR, outside records, labs, radiologic studies, echocardiogram & other cardiovascular studies, formation of treatment plan, after visit summary, future disposition, and lastly on documentation.              [1]   Social History  Tobacco Use   Smoking Status Former    Current packs/day: 0.00    Average packs/day: 1 pack/day for 30.0 years (30.0 ttl pk-yrs)    Types: Cigarettes    Quit date: 01/13/2008    Years since quitting: 15.6   Smokeless Tobacco Never

## 2023-09-14 NOTE — Assessment & Plan Note
 Lab Results   Component Value Date    CHOL 117 03/01/2023    TRIG 38 03/01/2023    HDL 49 03/01/2023    LDL 61 03/01/2023    VLDL 8 03/01/2023    NONHDLCHOL 65 07/21/2018    CHOLHDLC 2 03/01/2023      Atorva 80 mg/day.

## 2023-09-14 NOTE — Assessment & Plan Note
 First LAD and RCA stent procedure in 2003.  Stenting of LADD in 2007.  Then DES to RPD in 2017.  Most recent coronary arteriogram in 2020 showed occlusion of the RPD stent, mild/moderate in-stent restenosis of both LAD and diagonal stented segments.    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

## 2023-09-14 NOTE — Assessment & Plan Note
 Last CTA was in 2023 and showed stable thoracic and great vessel anatomy.   This anatomy has really been quite stable dating back to original diagnosis in 2008.

## 2023-09-15 ENCOUNTER — Encounter: Admit: 2023-09-15 | Discharge: 2023-09-15

## 2023-09-15 ENCOUNTER — Ambulatory Visit: Admit: 2023-09-15 | Discharge: 2023-09-15 | Payer: MEDICARE

## 2023-09-15 MED ORDER — LISINOPRIL 10 MG PO TAB
10 mg | ORAL_TABLET | Freq: Every day | ORAL | 3 refills | 90.00000 days | Status: AC
Start: 2023-09-15 — End: ?

## 2023-09-15 NOTE — Progress Notes
 To our valued patient,     We have enrolled your heart monitor and requested it be sent to your home.  You should receive this within 2-3 business days. Please wear the monitor for 14 days. When you have completed the study, please remove the device, and mail it back to the company. Please call [employer] Customer Service at [phone] with questions about placement, troubleshooting, and insurance coverage. You can reach the [facility] ambulatory heart monitor team at [phone].      Your Heart Rhythm Management Team  Cardiovascular Medicine Department at [facility]              Ambulatory (External) Cardiac Monitor Enrollment Record     Placement Location: Home Enrollment  Vendor: [employer] ([employer])  Mobile Cardiac Telemetry (MCOT/MCT)?: No  Duration of Monitor (in days): 14  Monitor Diagnosis: Dizziness (R42)  Secondary Monitor Diagnosis: Near Syncope (R55)  Ordering Provider: QUIN ELSPETH BIRCH [phone])  AMB Monitor Serial Number: Home Enrollment  No data recorded    Start Time and Date: 09/15/23 12:34 PM   Patient Name: Shane Roach  DOB: [birth year]  MRN: [id number]  Sex: male  Mobile Phone Number: [phone] (mobile)  Home Phone Number: [phone]  Patient Address: [id number] [place] [address]  Insurance Coverage: [employer] PPO  Insurance ID: [id number]  Insurance Group #: [id number]  Insurance Subscriber: Shane Roach,Shane Roach  Implanted Cardiac Device Information: No results found for: [id number]      Patient instructed to contact company phone number on the monitor box with questions regarding billing, placement, troubleshooting.     Shane Roach    ____________________________________________________________    Clinic Staff:    Complete additional steps for documentation double check/Co-Sign.  In Follow-up, send chart upon closing encounter to [facility] HR[facility] AMBULATORY MONITORS    HRM Ambulatory Monitoring Team:  Schedule on appropriate template and check-in.   Clinic Placement Schedule on clinic location [facility] schedule   Home Enrollment Schedule on Home Enrollment schedule ([facility])   Given to patient in clinic for self-placement Schedule on Home Enrollment schedule ([facility] R[facility]M)   Inpatient Schedule on [place] [facility] AMBULATORY MONITORING template   2. Please enroll with appropriate vendor.

## 2023-09-23 ENCOUNTER — Encounter: Admit: 2023-09-23 | Discharge: 2023-09-23

## 2023-10-05 ENCOUNTER — Encounter: Admit: 2023-10-05 | Discharge: 2023-10-05

## 2023-10-12 ENCOUNTER — Encounter: Admit: 2023-10-12 | Discharge: 2023-10-12

## 2023-10-12 ENCOUNTER — Ambulatory Visit: Admit: 2023-10-12 | Discharge: 2023-10-12

## 2023-10-18 ENCOUNTER — Encounter: Admit: 2023-10-18 | Discharge: 2023-10-18

## 2023-10-18 NOTE — Telephone Encounter
 Attempted to reach patient via phone. No answer or voicemail.  Sent mychart message with normal results.

## 2023-10-18 NOTE — Telephone Encounter
-----   Message from GORMAN Balloon, MD sent at 10/18/2023  2:53 PM CDT -----  [facility], can you please let [facility] know that this echo all looks stable.    Cc:  Dr. Naomia  ----- Message -----  From: Sherril Deans, MD  Sent: 10/05/2023   1:33 PM CDT  To: Elspeth JONETTA Balloon, MD

## 2023-10-20 ENCOUNTER — Encounter: Admit: 2023-10-20 | Discharge: 2023-10-20

## 2023-11-10 ENCOUNTER — Encounter: Admit: 2023-11-10 | Discharge: 2023-11-10

## 2023-11-16 ENCOUNTER — Encounter: Admit: 2023-11-16 | Discharge: 2023-11-16

## 2024-01-21 ENCOUNTER — Encounter: Admit: 2024-01-21 | Discharge: 2024-01-21

## 2024-02-11 ENCOUNTER — Encounter: Admit: 2024-02-11 | Discharge: 2024-02-11 | Payer: MEDICARE
